# Patient Record
Sex: Female | Born: 1995 | Race: Black or African American | Hispanic: No | Marital: Single | Smoking: Never smoker
Health system: Southern US, Community
[De-identification: ages and names within clinical notes are randomized; demographics above are authoritative.]

## PROBLEM LIST (undated history)

## (undated) DIAGNOSIS — E039 Hypothyroidism, unspecified: Secondary | ICD-10-CM

## (undated) DIAGNOSIS — J45909 Unspecified asthma, uncomplicated: Secondary | ICD-10-CM

## (undated) DIAGNOSIS — E079 Disorder of thyroid, unspecified: Secondary | ICD-10-CM

## (undated) DIAGNOSIS — R569 Unspecified convulsions: Secondary | ICD-10-CM

## (undated) DIAGNOSIS — O039 Complete or unspecified spontaneous abortion without complication: Secondary | ICD-10-CM

## (undated) HISTORY — PX: NO PAST SURGERIES: SHX2092

---
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
## 2016-08-31 ENCOUNTER — Emergency Department (HOSPITAL_COMMUNITY)
Admission: EM | Admit: 2016-08-31 | Discharge: 2016-09-01 | Disposition: A | Discharge status: Home / Self Care | Payer: 59 | Attending: Emergency Medicine | Admitting: Emergency Medicine

## 2016-08-31 ENCOUNTER — Encounter (HOSPITAL_COMMUNITY): Payer: Self-pay | Admitting: *Deleted

## 2016-08-31 DIAGNOSIS — N898 Other specified noninflammatory disorders of vagina: Secondary | ICD-10-CM | POA: Diagnosis not present

## 2016-08-31 DIAGNOSIS — R109 Unspecified abdominal pain: Secondary | ICD-10-CM | POA: Diagnosis present

## 2016-08-31 DIAGNOSIS — D649 Anemia, unspecified: Secondary | ICD-10-CM | POA: Diagnosis not present

## 2016-08-31 DIAGNOSIS — R1084 Generalized abdominal pain: Secondary | ICD-10-CM

## 2016-08-31 DIAGNOSIS — E876 Hypokalemia: Secondary | ICD-10-CM | POA: Insufficient documentation

## 2016-08-31 DIAGNOSIS — J45909 Unspecified asthma, uncomplicated: Secondary | ICD-10-CM | POA: Insufficient documentation

## 2016-08-31 HISTORY — DX: Disorder of thyroid, unspecified: E07.9

## 2016-08-31 HISTORY — DX: Unspecified asthma, uncomplicated: J45.909

## 2016-08-31 NOTE — ED Triage Notes (Signed)
Pt c/o lower abdominal pain x 2 weeks, worsening in the past week. Pt denies vag bleeding or discharge.

## 2016-09-01 LAB — COMPREHENSIVE METABOLIC PANEL
ALT: 11 U/L — ABNORMAL LOW (ref 14–54)
AST: 16 U/L (ref 15–41)
Albumin: 3.4 g/dL — ABNORMAL LOW (ref 3.5–5.0)
Alkaline Phosphatase: 64 U/L (ref 38–126)
Anion gap: 6 (ref 5–15)
BUN: 9 mg/dL (ref 6–20)
CO2: 28 mmol/L (ref 22–32)
CREATININE: 0.57 mg/dL (ref 0.44–1.00)
Calcium: 9 mg/dL (ref 8.9–10.3)
Chloride: 107 mmol/L (ref 101–111)
GFR calc Af Amer: >60 mL/min (ref >60)
GFR calc non Af Amer: >60 mL/min (ref >60)
Glucose, Bld: 99 mg/dL (ref 65–99)
POTASSIUM: 3 mmol/L — AB (ref 3.5–5.1)
Sodium: 141 mmol/L (ref 135–145)
Total Bilirubin: 0.2 mg/dL — ABNORMAL LOW (ref 0.3–1.2)
Total Protein: 6.5 g/dL (ref 6.5–8.1)

## 2016-09-01 LAB — CBC
HCT: 33.7 % — ABNORMAL LOW (ref 36.0–46.0)
Hemoglobin: 10.4 g/dL — ABNORMAL LOW (ref 12.0–15.0)
MCH: 22.5 pg — ABNORMAL LOW (ref 26.0–34.0)
MCHC: 30.9 g/dL (ref 30.0–36.0)
MCV: 72.8 fL — ABNORMAL LOW (ref 78.0–100.0)
Platelets: 326 10*3/uL (ref 150–400)
RBC: 4.63 MIL/uL (ref 3.87–5.11)
RDW: 17.3 % — ABNORMAL HIGH (ref 11.5–15.5)
WBC: 5.5 10*3/uL (ref 4.0–10.5)

## 2016-09-01 LAB — URINALYSIS, ROUTINE W REFLEX MICROSCOPIC
Bilirubin Urine: NEGATIVE
Glucose, UA: NEGATIVE mg/dL
Hgb urine dipstick: NEGATIVE
Ketones, ur: NEGATIVE mg/dL
LEUKOCYTES UA: NEGATIVE
Nitrite: NEGATIVE
Protein, ur: NEGATIVE mg/dL
Specific Gravity, Urine: 1.031 — ABNORMAL HIGH (ref 1.005–1.030)
pH: 7 (ref 5.0–8.0)

## 2016-09-01 LAB — I-STAT BETA HCG BLOOD, ED (MC, WL, AP ONLY): I-stat hCG, quantitative: <5 m[IU]/mL (ref <5)

## 2016-09-01 LAB — WET PREP, GENITAL
Sperm: NONE SEEN
Trich, Wet Prep: NONE SEEN
YEAST WET PREP: NONE SEEN

## 2016-09-01 LAB — GC/CHLAMYDIA PROBE AMP (~~LOC~~) NOT AT ARMC
Chlamydia: NEGATIVE
Neisseria Gonorrhea: NEGATIVE

## 2016-09-01 LAB — LIPASE, BLOOD: Lipase: 38 U/L (ref 11–51)

## 2016-09-01 MED ORDER — POTASSIUM CHLORIDE CRYS ER 20 MEQ PO TBCR: 20.0000 meq | EXTENDED_RELEASE_TABLET | Freq: Every day | ORAL | 0 refills | Status: DC

## 2016-09-01 MED ORDER — FERROUS SULFATE 325 (65 FE) MG PO TABS: 325.0000 mg | ORAL_TABLET | Freq: Every day | ORAL | 0 refills | Status: DC

## 2016-09-01 MED ORDER — POTASSIUM CHLORIDE CRYS ER 20 MEQ PO TBCR
80.0000 meq | EXTENDED_RELEASE_TABLET | Freq: Once | ORAL | Status: AC
  Administered 2016-09-01: 80 meq via ORAL
  Filled 2016-09-01: qty 4

## 2016-09-01 NOTE — Discharge Instructions (Signed)
1. Medications: ferrous sulfate, klor con, usual home medications 2. Treatment: rest, drink plenty of fluids, advance diet slowly 3. Follow Up: Please followup with your primary doctor in 2 days for discussion of your diagnoses and further evaluation after today's visit; if you do not have a primary care doctor use the resource guide provided to find one; Please return to the ER for persistent vomiting, high fevers or worsening symptoms

## 2016-09-01 NOTE — ED Provider Notes (Signed)
[facility]-EMER[facility]ENCY DEPT Provider Note   CSN: [id number] Arrival date & time: 08/31/16  2344     History   Chief Complaint Chief Complaint  Patient presents with  . Abdominal Pain    HPI Leah Allen is a 20 y.o. female with a hx of asthma, thyroid disease presents to the Emergency Department complaining of intermittent lower abd pain onset 2 weeks ago but worsening tonight. Patient reports she is pain-free at this time. Pain is sharp and stabbing last approximately 30 seconds and occurs every 2-4 hours.  She denies nausea or vomiting with the pain.  Patient reports she is eating well and has had normal stools.  No melena or hematochezia. No aggravating or alleviating factors. Patient is 6 active with one female partner for the last 2 years. No history of STD. Pt denies fever, chills, headache, neck pain, chest pain, SOB, N/V/D, weakness, dizziness, syncope.  Pt also denies vaginal bleeding or discharge.  Denies urinary symptoms.  LMP: finished 3 days ago .     The history is provided by the patient and medical records. No language interpreter was used.  Abdominal Pain      Past Medical History:  Diagnosis Date  . Asthma   . Thyroid disease     There are no active problems to display for this patient.   History reviewed. No pertinent surgical history.  OB History    No data available       Home Medications    Prior to Admission medications   Medication Sig Start Date End Date Taking? Authorizing Provider  levothyroxine (SYNTHROID, LEVOTHROID) 25 MCG tablet Take 25 mcg by mouth daily before breakfast.   Yes Historical Provider, MD  ferrous sulfate 325 (65 FE) MG tablet Take 1 tablet (325 mg total) by mouth daily. 09/01/16   Avion Kutzer, PA-C  potassium chloride SA (K-DUR,KLOR-CON) 20 MEQ tablet Take 1 tablet (20 mEq total) by mouth daily. 09/01/16   Greysen Devino, PA-C    Family History No family history on file.  Social History Social History    Substance Use Topics  . Smoking status: Never Smoker  . Smokeless tobacco: Never Used  . Alcohol use Yes     Allergies   Sulfa antibiotics   Review of Systems Review of Systems  Gastrointestinal: Positive for abdominal pain.  All other systems reviewed and are negative.    Physical Exam Updated Vital Signs BP 95/76   Pulse 99   Temp 98.3 F (36.8 C) (Oral)   Resp 16   LMP 08/29/2016   SpO2 97%   Physical Exam  Constitutional: She appears well-developed and well-nourished. No distress.  Awake, alert, nontoxic appearance  HENT:  Head: Normocephalic and atraumatic.  Mouth/Throat: Oropharynx is clear and moist. No oropharyngeal exudate.  Eyes: Conjunctivae are normal. No scleral icterus.  Neck: Normal range of motion. Neck supple.  Cardiovascular: Normal rate, regular rhythm, normal heart sounds and intact distal pulses.   No murmur heard. Pulmonary/Chest: Effort normal and breath sounds normal. No respiratory distress. She has no wheezes.  Equal chest expansion  Abdominal: Soft. Bowel sounds are normal. She exhibits no mass. There is no tenderness. There is no rebound and no guarding. Hernia confirmed negative in the right inguinal area and confirmed negative in the left inguinal area.  Genitourinary: Uterus normal. No labial fusion. There is no rash, tenderness or lesion on the right labia. There is no rash, tenderness or lesion on the left labia. Uterus is not deviated,  not enlarged, not fixed and not tender. Cervix exhibits no motion tenderness, no discharge and no friability. Right adnexum displays no mass, no tenderness and no fullness. Left adnexum displays no mass, no tenderness and no fullness. No erythema, tenderness or bleeding in the vagina. No foreign body in the vagina. No signs of injury around the vagina. Vaginal discharge (Thin, white, non-odorous) found.  Musculoskeletal: Normal range of motion. She exhibits no edema.  Lymphadenopathy:       Right: No  inguinal adenopathy present.       Left: No inguinal adenopathy present.  Neurological: She is alert.  Speech is clear and goal oriented Moves extremities without ataxia  Skin: Skin is warm and dry. She is not diaphoretic. No erythema.  Psychiatric: She has a normal mood and affect.  Nursing note and vitals reviewed.    ED Treatments / Results  Labs (all labs ordered are listed, but only abnormal results are displayed) Labs Reviewed  WET PREP, GENITAL - Abnormal; Notable for the following:       Result Value   Clue Cells Wet Prep HPF POC PRESENT (*)    WBC, Wet Prep HPF POC MANY (*)    All other components within normal limits  COMPREHENSIVE METABOLIC PANEL - Abnormal; Notable for the following:    Potassium 3.0 (*)    Albumin 3.4 (*)    ALT 11 (*)    Total Bilirubin 0.2 (*)    All other components within normal limits  CBC - Abnormal; Notable for the following:    Hemoglobin 10.4 (*)    HCT 33.7 (*)    MCV 72.8 (*)    MCH 22.5 (*)    RDW 17.3 (*)    All other components within normal limits  URINALYSIS, ROUTINE W REFLEX MICROSCOPIC (NOT AT [facility]) - Abnormal; Notable for the following:    Specific Gravity, Urine 1.031 (*)    All other components within normal limits  LIPASE, BLOOD  I-STAT BETA HCG BLOOD, ED ([facility], [facility], [facility] ONLY)  GC/CHLAMYDIA PROBE AMP ([facility]) NOT AT [facility]    Procedures Procedures (including critical care time)  Medications Ordered in ED Medications  potassium chloride SA (K-DUR,KLOR-CON) CR tablet 80 mEq (80 mEq Oral Given 09/01/16 0153)     Initial Impression / Assessment and Plan / ED Course  I have reviewed the triage vital signs and the nursing notes.  Pertinent labs & imaging results that were available during my care of the patient were reviewed by me and considered in my medical decision making (see chart for details).  Clinical Course as of Sep 02 347  [facility] Sep 01, 2016  0119 Hypokalemia.  Replaced begun in the department.   Potassium:  (!) 3.0 [HM]  0120 No UTI Leukocytes, UA: NEGATIVE [HM]  0120 Anemia noted.  Unknown baseline.  Pt without CP or SOB.   Hemoglobin: (!) 10.4 [HM]  0120 Lipase: 38 [HM]  0346 Pt remains pain free.  On repeat exam her abd remains soft and nontender. She is well appearing. VSS.   [HM]    Clinical Course User Index [HM] Dierdre Forth, PA-C    Patient with intermittent lower abdominal pain. Not currently present.  On exam patient was soft and nontender abdomen; no rebound or guarding.  No cervical motion or adnexal tenderness. No adnexal masses. Doubt appendicitis, colitis, diverticulitis, tubo-ovarian abscess, PID or ovarian torsion. Patient is well-appearing, eating and drinking within the room without difficulty. Hypokalemia noted and repletion begun. Mild  anemia. Suspect iron deficiency. Patient has never seen an OB/GYN. Will have her follow with PCP and OB/GYN. Iron and potassium supplements given.  BP 119/90 (BP Location: Right Arm)   Pulse 96   Temp 98.3 F (36.8 C) (Oral)   Resp 16   LMP 08/29/2016   SpO2 99%    Final Clinical Impressions(s) / ED Diagnoses   Final diagnoses:  Hypokalemia  Anemia, unspecified type  Generalized abdominal pain    New Prescriptions New Prescriptions   FERROUS SULFATE 325 (65 FE) MG TABLET    Take 1 tablet (325 mg total) by mouth daily.   POTASSIUM CHLORIDE SA (K-DUR,KLOR-CON) 20 MEQ TABLET    Take 1 tablet (20 mEq total) by mouth daily.     Dahlia ClientHannah Amairani Shuey, PA-C 09/01/16 0355    Layla MawKristen N Ward, DO 09/01/16 [phone]

## 2016-09-08 ENCOUNTER — Emergency Department (HOSPITAL_COMMUNITY)
Admission: EM | Admit: 2016-09-08 | Discharge: 2016-09-08 | Disposition: A | Discharge status: Home / Self Care | Payer: 59 | Attending: Emergency Medicine | Admitting: Emergency Medicine

## 2016-09-08 ENCOUNTER — Encounter (HOSPITAL_COMMUNITY): Payer: Self-pay | Admitting: Emergency Medicine

## 2016-09-08 DIAGNOSIS — H9203 Otalgia, bilateral: Secondary | ICD-10-CM | POA: Diagnosis present

## 2016-09-08 DIAGNOSIS — J45909 Unspecified asthma, uncomplicated: Secondary | ICD-10-CM | POA: Insufficient documentation

## 2016-09-08 DIAGNOSIS — H6693 Otitis media, unspecified, bilateral: Secondary | ICD-10-CM | POA: Diagnosis not present

## 2016-09-08 MED ORDER — OXYMETAZOLINE HCL 0.05 % NA SOLN
1.0000 | Freq: Once | NASAL | Status: AC
  Administered 2016-09-08: 1 via NASAL
  Filled 2016-09-08: qty 15

## 2016-09-08 MED ORDER — AMOXICILLIN 500 MG PO CAPS: 500.0000 mg | ORAL_CAPSULE | Freq: Three times a day (TID) | ORAL | 0 refills | Status: DC

## 2016-09-08 MED ORDER — AMOXICILLIN 500 MG PO CAPS
500.0000 mg | ORAL_CAPSULE | Freq: Once | ORAL | Status: AC
  Administered 2016-09-08: 500 mg via ORAL
  Filled 2016-09-08: qty 1

## 2016-09-08 MED ORDER — IBUPROFEN 800 MG PO TABS
800.0000 mg | ORAL_TABLET | Freq: Once | ORAL | Status: AC
  Administered 2016-09-08: 800 mg via ORAL
  Filled 2016-09-08: qty 1

## 2016-09-08 MED ORDER — FLUTICASONE PROPIONATE 50 MCG/ACT NA SUSP
2.0000 | Freq: Every day | NASAL | 2 refills | Status: DC
Start: 2016-09-08 — End: 2017-08-15

## 2016-09-08 NOTE — ED Provider Notes (Signed)
[facility] Provider Note   CSN: [id number] Arrival date & time: 09/08/16  0149     History   Chief Complaint Chief Complaint  Patient presents with  . Otalgia    HPI Leah Allen is a 20 y.o. female with a hx of asthma, thyroid disease presents to the Emergency Department complaining of gradual, persistent, progressively worsening bilateral ear pain.  Pt reports L ear pain x 3 days and right ear pain x 2-3 hours.  Associated symptoms include URI symptoms times 3-4 days including nasal congestion, postnasal drip, rhinorrhea and sinus fullness. Patient reports taking DayQuil with some relief. Cold air seems to make the symptoms worse. Patient denies fever, chills, neck pain, chest pain, shortness of breath, abdominal pain, nausea, vomiting, rash.     The history is provided by the patient and medical records. No language interpreter was used.    Past Medical History:  Diagnosis Date  . Asthma   . Thyroid disease     There are no active problems to display for this patient.   History reviewed. No pertinent surgical history.  OB History    No data available       Home Medications    Prior to Admission medications   Medication Sig Start Date End Date Taking? Authorizing Provider  amoxicillin (AMOXIL) 500 MG capsule Take 1 capsule (500 mg total) by mouth 3 (three) times daily. 09/08/16   Julious Langlois, PA-C  ferrous sulfate 325 (65 FE) MG tablet Take 1 tablet (325 mg total) by mouth daily. 09/01/16   Ravenna Legore, PA-C  fluticasone (FLONASE) 50 MCG/ACT nasal spray Place 2 sprays into both nostrils daily. 09/08/16   Carvin Almas, PA-C  levothyroxine (SYNTHROID, LEVOTHROID) 25 MCG tablet Take 25 mcg by mouth daily before breakfast.    Historical Provider, MD  potassium chloride SA (K-DUR,KLOR-CON) 20 MEQ tablet Take 1 tablet (20 mEq total) by mouth daily. 09/01/16   Kristoph Sattler, PA-C    Family History No family history on  file.  Social History Social History  Substance Use Topics  . Smoking status: Never Smoker  . Smokeless tobacco: Never Used  . Alcohol use Yes     Allergies   Sulfa antibiotics   Review of Systems Review of Systems  HENT: Positive for congestion, ear pain ( Bilateral), postnasal drip, rhinorrhea, sinus pressure and sore throat.   Respiratory: Negative for cough.   All other systems reviewed and are negative.    Physical Exam Updated Vital Signs BP 114/82 (BP Location: Right Arm)   Pulse 98   Temp 97.9 F (36.6 C) (Oral)   Resp 18   LMP 08/29/2016   SpO2 100%   Physical Exam  Constitutional: She appears well-developed and well-nourished. No distress.  HENT:  Head: Normocephalic and atraumatic.  Right Ear: External ear and ear canal normal. Tympanic membrane is injected, erythematous and bulging. A middle ear effusion is present.  Left Ear: External ear and ear canal normal. Tympanic membrane is injected, erythematous and bulging. A middle ear effusion is present.  Nose: Mucosal edema and rhinorrhea present. No epistaxis. Right sinus exhibits no maxillary sinus tenderness and no frontal sinus tenderness. Left sinus exhibits no maxillary sinus tenderness and no frontal sinus tenderness.  Mouth/Throat: Uvula is midline and mucous membranes are normal. Mucous membranes are not pale and not cyanotic. No oropharyngeal exudate, posterior oropharyngeal edema, posterior oropharyngeal erythema or tonsillar abscesses.  Eyes: Conjunctivae are normal. Pupils are equal, round, and reactive to light.  Neck: Normal range of motion and full passive range of motion without pain.  Cardiovascular: Normal rate and intact distal pulses.   Pulmonary/Chest: Effort normal and breath sounds normal. No stridor.  Clear and equal breath sounds without focal wheezes, rhonchi, rales  Abdominal: Soft. There is no tenderness.  Musculoskeletal: Normal range of motion.  Lymphadenopathy:    She has no  cervical adenopathy.  Neurological: She is alert.  Skin: Skin is warm and dry. No rash noted. She is not diaphoretic.  Psychiatric: She has a normal mood and affect.  Nursing note and vitals reviewed.    ED Treatments / Results   Procedures Procedures (including critical care time)  Medications Ordered in ED Medications  ibuprofen (ADVIL,MOTRIN) tablet 800 mg (800 mg Oral Given 09/08/16 0235)  oxymetazoline (AFRIN) 0.05 % nasal spray 1 spray (1 spray Each Nare Given 09/08/16 0235)  amoxicillin (AMOXIL) capsule 500 mg (500 mg Oral Given 09/08/16 0235)     Initial Impression / Assessment and Plan / ED Course  I have reviewed the triage vital signs and the nursing notes.  Pertinent labs & imaging results that were available during my care of the patient were reviewed by me and considered in my medical decision making (see chart for details).  Clinical Course as of Sep 08 244  Mon Sep 08, 2016  0209 Tachycardia noted at triage Pulse Rate: 110 [HM]    Clinical Course User Index [HM] Dierdre ForthHannah Anwita Mencer, PA-C     Patient presents with otalgia and exam consistent with acute otitis media. No concern for acute mastoiditis, meningitis.  Patient given amoxicillin.  Advised parents to call PCP today for follow-up.  I have also discussed reasons to return immediately to the ER.  Parent expresses understanding and agrees with plan.     Final Clinical Impressions(s) / ED Diagnoses   Final diagnoses:  Bilateral otitis media, unspecified otitis media type    New Prescriptions New Prescriptions   AMOXICILLIN (AMOXIL) 500 MG CAPSULE    Take 1 capsule (500 mg total) by mouth 3 (three) times daily.   FLUTICASONE (FLONASE) 50 MCG/ACT NASAL SPRAY    Place 2 sprays into both nostrils daily.     Dahlia ClientHannah Cassian Torelli, PA-C 09/08/16 0245    Dione Boozeavid Glick, MD 09/08/16 [phone]

## 2016-09-08 NOTE — ED Triage Notes (Signed)
L ear pain x 3 days.  Severe R ear pain x 2-3 hours.

## 2016-09-08 NOTE — Discharge Instructions (Signed)
1. Medications: amoxicillin, flonase, usual home medications 2. Treatment: rest, drink plenty of fluids, 3. Follow Up: Please followup with your primary doctor in 3-5 days for discussion of your diagnoses and further evaluation after today's visit; if you do not have a primary care doctor use the resource guide provided to find one; Please return to the ER for high fevers, drainage from the ear or other concerns

## 2017-08-10 ENCOUNTER — Inpatient Hospital Stay (HOSPITAL_COMMUNITY)
Admission: AC | Admit: 2017-08-10 | Discharge: 2017-08-11 | DRG: 779 | Disposition: A | Discharge status: Home / Self Care | Payer: BLUE CROSS/BLUE SHIELD | Source: Ambulatory Visit | Attending: Family Medicine | Admitting: Family Medicine

## 2017-08-10 ENCOUNTER — Encounter (HOSPITAL_COMMUNITY): Payer: Self-pay | Admitting: *Deleted

## 2017-08-10 DIAGNOSIS — Z3A19 19 weeks gestation of pregnancy: Secondary | ICD-10-CM | POA: Diagnosis not present

## 2017-08-10 DIAGNOSIS — J45909 Unspecified asthma, uncomplicated: Secondary | ICD-10-CM | POA: Diagnosis present

## 2017-08-10 DIAGNOSIS — O9952 Diseases of the respiratory system complicating childbirth: Secondary | ICD-10-CM | POA: Diagnosis present

## 2017-08-10 DIAGNOSIS — Z882 Allergy status to sulfonamides status: Secondary | ICD-10-CM

## 2017-08-10 DIAGNOSIS — E039 Hypothyroidism, unspecified: Secondary | ICD-10-CM | POA: Diagnosis present

## 2017-08-10 DIAGNOSIS — Z6791 Unspecified blood type, Rh negative: Secondary | ICD-10-CM

## 2017-08-10 DIAGNOSIS — O021 Missed abortion: Secondary | ICD-10-CM | POA: Diagnosis present

## 2017-08-10 DIAGNOSIS — O26899 Other specified pregnancy related conditions, unspecified trimester: Secondary | ICD-10-CM

## 2017-08-10 DIAGNOSIS — Z79899 Other long term (current) drug therapy: Secondary | ICD-10-CM

## 2017-08-10 DIAGNOSIS — O99284 Endocrine, nutritional and metabolic diseases complicating childbirth: Secondary | ICD-10-CM | POA: Diagnosis present

## 2017-08-10 HISTORY — DX: Hypothyroidism, unspecified: E03.9

## 2017-08-10 LAB — CBC
HCT: 27.4 % — ABNORMAL LOW (ref 36.0–46.0)
HEMOGLOBIN: 8.6 g/dL — AB (ref 12.0–15.0)
MCH: 21.8 pg — AB (ref 26.0–34.0)
MCHC: 31.4 g/dL (ref 30.0–36.0)
MCV: 69.5 fL — ABNORMAL LOW (ref 78.0–100.0)
PLATELETS: 274 10*3/uL (ref 150–400)
RBC: 3.94 MIL/uL (ref 3.87–5.11)
RDW: 18.2 % — ABNORMAL HIGH (ref 11.5–15.5)
WBC: 16.4 10*3/uL — AB (ref 4.0–10.5)

## 2017-08-10 LAB — ABO/RH: ABO/RH(D): B NEG

## 2017-08-10 LAB — TYPE AND SCREEN
ABO/RH(D): B NEG
ANTIBODY SCREEN: NEGATIVE

## 2017-08-10 MED ORDER — TETANUS-DIPHTH-ACELL PERTUSSIS 5-2.5-18.5 LF-MCG/0.5 IM SUSP: 0.5000 mL | Freq: Once | INTRAMUSCULAR | Status: DC

## 2017-08-10 MED ORDER — PRENATAL MULTIVITAMIN CH: 1.0000 | ORAL_TABLET | Freq: Every day | ORAL | Status: DC

## 2017-08-10 MED ORDER — ONDANSETRON HCL 4 MG/2ML IJ SOLN: 4.0000 mg | INTRAMUSCULAR | Status: DC | PRN

## 2017-08-10 MED ORDER — OXYTOCIN 10 UNIT/ML IJ SOLN
10.0000 [IU] | Freq: Once | INTRAMUSCULAR | Status: AC
Start: 2017-08-10 — End: 2017-08-10
  Administered 2017-08-10: 10 [IU] via INTRAMUSCULAR

## 2017-08-10 MED ORDER — COCONUT OIL OIL: 1.0000 "application " | TOPICAL_OIL | Status: DC | PRN

## 2017-08-10 MED ORDER — OXYTOCIN 10 UNIT/ML IJ SOLN
INTRAMUSCULAR | Status: AC
  Administered 2017-08-10: 10 [IU] via INTRAMUSCULAR
  Filled 2017-08-10: qty 1

## 2017-08-10 MED ORDER — SENNOSIDES-DOCUSATE SODIUM 8.6-50 MG PO TABS: 2.0000 | ORAL_TABLET | ORAL | Status: DC

## 2017-08-10 MED ORDER — LIDOCAINE HCL (PF) 1 % IJ SOLN
INTRAMUSCULAR | Status: AC
  Filled 2017-08-10: qty 30

## 2017-08-10 MED ORDER — DIBUCAINE 1 % RE OINT: 1.0000 "application " | TOPICAL_OINTMENT | RECTAL | Status: DC | PRN

## 2017-08-10 MED ORDER — WITCH HAZEL-GLYCERIN EX PADS: 1.0000 "application " | MEDICATED_PAD | CUTANEOUS | Status: DC | PRN

## 2017-08-10 MED ORDER — RHO D IMMUNE GLOBULIN 1500 UNIT/2ML IJ SOSY
300.0000 ug | PREFILLED_SYRINGE | Freq: Once | INTRAMUSCULAR | Status: AC
  Administered 2017-08-11: 300 ug via INTRAMUSCULAR
  Filled 2017-08-10: qty 2

## 2017-08-10 MED ORDER — BENZOCAINE-MENTHOL 20-0.5 % EX AERO: 1.0000 "application " | INHALATION_SPRAY | CUTANEOUS | Status: DC | PRN

## 2017-08-10 MED ORDER — SIMETHICONE 80 MG PO CHEW: 80.0000 mg | CHEWABLE_TABLET | ORAL | Status: DC | PRN

## 2017-08-10 MED ORDER — ONDANSETRON HCL 4 MG PO TABS: 4.0000 mg | ORAL_TABLET | ORAL | Status: DC | PRN

## 2017-08-10 MED ORDER — IBUPROFEN 600 MG PO TABS
600.0000 mg | ORAL_TABLET | Freq: Four times a day (QID) | ORAL | Status: DC
  Administered 2017-08-10: 600 mg via ORAL
  Filled 2017-08-10: qty 1

## 2017-08-10 MED ORDER — ZOLPIDEM TARTRATE 5 MG PO TABS: 5.0000 mg | ORAL_TABLET | Freq: Every evening | ORAL | Status: DC | PRN

## 2017-08-10 MED ORDER — DIPHENHYDRAMINE HCL 25 MG PO CAPS: 25.0000 mg | ORAL_CAPSULE | Freq: Four times a day (QID) | ORAL | Status: DC | PRN

## 2017-08-10 MED ORDER — ACETAMINOPHEN 325 MG PO TABS: 650.0000 mg | ORAL_TABLET | ORAL | Status: DC | PRN

## 2017-08-10 NOTE — H&P (Signed)
Obstetric History and Physical  Leah Allen is a 21 y.o. G1P0 with IUP at Unknown presenting s/p SVD at home. Patient states that she started feeling unwell at work around noontime. She went home to rest after work and started feeling really bad. She endorses some fever/chills, shakes, severe abdominal pain. She thought she had to have a bowel movement so went to the bathroom. Instead she delivered her baby in toilet.   Patient had no prenatal care this pregnancy. States she was taking her PNV.   Baby was a stillborn and was not viable based off weight.   Prenatal Course Source of Care: No prenatal care[facility] Dating: By LMP -->  Estimated Date of Delivery: 10/27/2017  Prenatal labs and studies: ABO, Rh:  UNKNOWN Antibody:   UNKNOWN Rubella:   UNKNOWN RPR:   UNKNOWN HBsAg:   UNKNOWN HIV:   UNKNOWN GBS:  UNKNOWN 1 hr Glucola  None Genetic screening None Anatomy US None   Past Medical History:  Diagnosis Date  . Asthma   . Hypothyroidism   . Thyroid disease     History reviewed. No pertinent surgical history.  OB History  No data available    Social History   Social History  . Marital status: Single    Spouse name: N/A  . Number of children: N/A  . Years of education: N/A   Social History Main Topics  . Smoking status: Never Smoker  . Smokeless tobacco: Never Used  . Alcohol use Yes  . Drug use: Yes    Types: Marijuana  . Sexual activity: Yes   Other Topics Concern  . None   Social History Narrative  . None    History reviewed. No pertinent family history.  Prescriptions Prior to Admission  Medication Sig Dispense Refill Last Dose  . amoxicillin (AMOXIL) 500 MG capsule Take 1 capsule (500 mg total) by mouth 3 (three) times daily. 21 capsule 0   . ferrous sulfate 325 (65 FE) MG tablet Take 1 tablet (325 mg total) by mouth daily. 30 tablet 0   . fluticasone (FLONASE) 50 MCG/ACT nasal spray Place 2  sprays into both nostrils daily. 9.9 g 2   . levothyroxine (SYNTHROID, LEVOTHROID) 25 MCG tablet Take 25 mcg by mouth daily before breakfast.   08/31/2016 at Unknown time  . potassium chloride SA (K-DUR,KLOR-CON) 20 MEQ tablet Take 1 tablet (20 mEq total) by mouth daily. 7 tablet 0     Allergies  Allergen Reactions  . Sulfa Antibiotics Anaphylaxis and Swelling    Review of Systems: Negative except for what is mentioned in HPI.  Physical Exam: BP (!) 114/54   Pulse (!) 124   Resp 18   Ht  (1.575 m)   Wt 81.6 kg (180 lb)   BMI 32.92 kg/m  CONSTITUTIONAL: Well-developed, well-nourished female in no acute distress.  HENT:  Normocephalic, atraumatic, External right and left ear normal. Oropharynx is clear and moist EYES: Conjunctivae and EOM are normal. Pupils are equal, round, and reactive to light. No scleral icterus.  NECK: Normal range of motion, supple, no masses SKIN: Skin is warm and dry. No rash noted. Not diaphoretic. No erythema. No pallor. NEUROLOGIC: Alert and oriented to person, place, and time. Normal reflexes, muscle tone coordination. No cranial nerve deficit noted. PSYCHIATRIC: Normal mood and affect. Normal behavior. Normal judgment and thought content. CARDIOVASCULAR: Normal heart rate noted, regular rhythm RESPIRATORY: Effort and breath sounds normal, no problems  with respiration noted ABDOMEN: Soft, nontender, nondistended, gravid. MUSCULOSKELETAL: Normal range of motion. No edema and no tenderness. 2+ distal pulses.   Pertinent Labs/Studies:   No results found for this or any previous visit (from the past 24 hour(s)).  Assessment : Leah Allen is a 21 y.o. who is s/p SVD at home of preterm, previable infant.   Plan: Routine postpartum care IM Pitocin for prophylaxis after delivery CSW consult and chaplin prn  Caryl Ada, DO OB Fellow Faculty Practice, [facility] [place] 08/10/2017, 9:05 PM

## 2017-08-10 NOTE — Discharge Instructions (Signed)

## 2017-08-10 NOTE — Discharge Summary (Signed)
OB Discharge Summary  Patient Name: Leah Allen DOB: [birth year] MRN: [id number]  Date of admission: 08/10/2017 Delivering MD:     Date of discharge: 08/10/2017  Admitting diagnosis: BROUGHT IN ON EMS Intrauterine pregnancy: [redacted]w[redacted]d     Secondary diagnosis:Active Problems:   Postpartum care following vaginal delivery  Additional problems:second trimester loss     Discharge diagnosis: 19 wk loss                                                                     Post partum procedures:rhogam  Augmentation: none  Complications: [facility] course:  Onset of Labor With Vaginal Delivery     21 y.o. yo G1P0 at [redacted]w[redacted]d was admitted following delivery at home on 08/10/2017. Patient had an uncomplicated labor course as follows:  Arrived via EMS. Placenta had already delivered. Patient had a delivery of a Non Viable infant. Information for the patient's newborn:  Leah Allen [[id number]]       Pateint had an uncomplicated postpartum course.  She is ambulating, tolerating a regular diet, passing flatus, and urinating well. Patient is discharged home in stable condition on 08/10/17. She received Rhogam.   Physical exam  Vitals:   08/10/17 2145 08/10/17 2200 08/10/17 2231 08/10/17 2322  BP: 96/61 103/61 138/68 135/64  Pulse: (!) 120 (!) 114 (!) 111 (!) 108  Resp: Temp:    98.8 F (37.1 C)  TempSrc:    Oral  Weight:      Height:       General: alert, cooperative and no distress Lochia: appropriate Uterine Fundus: firm DVT Evaluation: No evidence of DVT seen on physical exam. Labs: Lab Results  Component Value Date   WBC 16.4 (H) 08/10/2017   HGB 8.6 (L) 08/10/2017   HCT 27.4 (L) 08/10/2017   MCV 69.5 (L) 08/10/2017   PLT 274 08/10/2017   CMP Latest Ref Rng & Units 08/31/2016  Glucose 65 - 99 mg/dL 99  BUN 6 - 20 mg/dL 9  Creatinine 4.69 - 6.29 mg/dL 5.28  Sodium 413 - 244 mmol/L 141  Potassium 3.5 - 5.1 mmol/L 3.0(L)   Chloride 101 - 111 mmol/L 107  CO2 22 - 32 mmol/L 28  Calcium 8.9 - 10.3 mg/dL 9.0  Total Protein 6.5 - 8.1 g/dL 6.5  Total Bilirubin 0.3 - 1.2 mg/dL 0.1(U)  Alkaline Phos 38 - 126 U/L 64  AST 15 - 41 U/L 16  ALT 14 - 54 U/L 11(L)    Discharge instruction: per After Visit Summary and "Baby and Me Booklet".  After Visit Meds:  Allergies as of 08/10/2017      Reactions   Sulfa Antibiotics Anaphylaxis, Swelling      Medication List    TAKE these medications   amoxicillin 500 MG capsule Commonly known as:  AMOXIL Take 1 capsule (500 mg total) by mouth 3 (three) times daily.   ferrous sulfate 325 (65 FE) MG tablet Take 1 tablet (325 mg total) by mouth daily.   fluticasone 50 MCG/ACT nasal spray Commonly known as:  FLONASE Place 2 sprays into both nostrils daily.   levothyroxine 25 MCG tablet Commonly known as:  SYNTHROID, LEVOTHROID Take 25 mcg  by mouth daily before breakfast.   potassium chloride SA 20 MEQ tablet Commonly known as:  K-DUR,KLOR-CON Take 1 tablet (20 mEq total) by mouth daily.       Diet: routine diet  Activity: Advance as tolerated. Pelvic rest for 6 weeks.   Outpatient follow up:4 wks Follow up Appt:No future appointments. Follow up visit: No Follow-up on file.  Postpartum contraception: Condoms  Newborn Data: Still born female  Birth Weight:  230 gms   Newborn Delivery   Birth date/time:   Delivery type:       Disposition:pathology   08/10/2017 Reva Bores, MD

## 2017-08-12 LAB — RH IG WORKUP (INCLUDES ABO/RH)
ABO/RH(D): B NEG
GESTATIONAL AGE(WKS): 19
UNIT DIVISION: 0

## 2017-08-15 ENCOUNTER — Inpatient Hospital Stay (HOSPITAL_COMMUNITY)
Admission: AD | Admit: 2017-08-15 | Discharge: 2017-08-15 | Disposition: A | Discharge status: Home / Self Care | Payer: BLUE CROSS/BLUE SHIELD | Source: Ambulatory Visit | Attending: Obstetrics and Gynecology | Admitting: Obstetrics and Gynecology

## 2017-08-15 ENCOUNTER — Encounter (HOSPITAL_COMMUNITY): Payer: Self-pay

## 2017-08-15 DIAGNOSIS — R109 Unspecified abdominal pain: Secondary | ICD-10-CM | POA: Diagnosis present

## 2017-08-15 DIAGNOSIS — O8612 Endometritis following delivery: Secondary | ICD-10-CM | POA: Insufficient documentation

## 2017-08-15 DIAGNOSIS — N719 Inflammatory disease of uterus, unspecified: Secondary | ICD-10-CM

## 2017-08-15 HISTORY — DX: Unspecified convulsions: R56.9

## 2017-08-15 LAB — CBC WITH DIFFERENTIAL/PLATELET
Basophils Absolute: 0 10*3/uL (ref 0.0–0.1)
Basophils Relative: 0 %
EOS PCT: 4 %
Eosinophils Absolute: 0.4 10*3/uL (ref 0.0–0.7)
HEMATOCRIT: 25.5 % — AB (ref 36.0–46.0)
Hemoglobin: 7.8 g/dL — ABNORMAL LOW (ref 12.0–15.0)
LYMPHS ABS: 2.8 10*3/uL (ref 0.7–4.0)
Lymphocytes Relative: 32 %
MCH: 21.8 pg — AB (ref 26.0–34.0)
MCHC: 30.6 g/dL (ref 30.0–36.0)
MCV: 71.4 fL — ABNORMAL LOW (ref 78.0–100.0)
Monocytes Absolute: 0.4 10*3/uL (ref 0.1–1.0)
Monocytes Relative: 5 %
NEUTROS ABS: 5.2 10*3/uL (ref 1.7–7.7)
Neutrophils Relative %: 59 %
OTHER: 0 %
Platelets: 324 10*3/uL (ref 150–400)
RBC: 3.57 MIL/uL — AB (ref 3.87–5.11)
RDW: 18.6 % — AB (ref 11.5–15.5)
WBC: 8.8 10*3/uL (ref 4.0–10.5)

## 2017-08-15 LAB — URINALYSIS, ROUTINE W REFLEX MICROSCOPIC
Bacteria, UA: NONE SEEN
Bilirubin Urine: NEGATIVE
GLUCOSE, UA: NEGATIVE mg/dL
KETONES UR: NEGATIVE mg/dL
Nitrite: NEGATIVE
PH: 5 (ref 5.0–8.0)
Protein, ur: 100 mg/dL — AB
SPECIFIC GRAVITY, URINE: 1.04 — AB (ref 1.005–1.030)

## 2017-08-15 LAB — WET PREP, GENITAL
CLUE CELLS WET PREP: NONE SEEN
SPERM: NONE SEEN
Trich, Wet Prep: NONE SEEN
Yeast Wet Prep HPF POC: NONE SEEN

## 2017-08-15 MED ORDER — CEFTRIAXONE SODIUM 1 G IJ SOLR
1.0000 g | Freq: Once | INTRAMUSCULAR | Status: AC
  Administered 2017-08-15: 1 g via INTRAMUSCULAR
  Filled 2017-08-15: qty 10

## 2017-08-15 MED ORDER — AZITHROMYCIN 500 MG PO TABS
1000.0000 mg | ORAL_TABLET | Freq: Once | ORAL | Status: AC
  Administered 2017-08-15: 1000 mg via ORAL
  Filled 2017-08-15: qty 2

## 2017-08-15 MED ORDER — OXYCODONE-ACETAMINOPHEN 5-325 MG PO TABS
1.0000 | ORAL_TABLET | Freq: Once | ORAL | Status: AC
  Administered 2017-08-15: 1 via ORAL
  Filled 2017-08-15: qty 1

## 2017-08-15 MED ORDER — OXYCODONE-ACETAMINOPHEN 5-325 MG PO TABS: 1.0000 | ORAL_TABLET | ORAL | 0 refills | Status: DC | PRN

## 2017-08-15 MED ORDER — CEPHALEXIN 500 MG PO CAPS: 500.0000 mg | ORAL_CAPSULE | Freq: Four times a day (QID) | ORAL | 0 refills | Status: DC

## 2017-08-15 NOTE — Discharge Instructions (Signed)
Endometritis °Endometritis is irritation, soreness, or inflammation that affects the lining of the uterus (endometrium). °Infection is usually the cause of endometritis. It is important to get treatment to prevent complications. Common complications may include more severe infections and not being able to have children(infertility). °What are the causes? °This condition may be caused by: °· Bacterial infections. °· STIs (sexually transmitted infections). °· A miscarriage or childbirth, especially after a long labor or cesarean delivery. °· Certain gynecological procedures. These may include dilation and curettage (D&C), hysteroscopy, or birth control (contraceptive) insertion. °· Tuberculosis (TB). ° °What are the signs or symptoms? °Symptoms of this condition include: °· Fever. °· Lower abdomen (abdominal) pain. °· Pelvis (pelvic) pain. °· Abnormal vaginal discharge or bleeding. °· Abdominal bloating (distention) or swelling. °· General discomfort or generally feeling ill. °· Discomfort with bowel movements. °· Constipation. ° °How is this diagnosed? °This condition may be diagnosed based on: °· A physical exam, including a pelvic exam. °· Tests, such as: °? Blood tests. °? Removal of a sample of endometrial tissue for testing (endometrial biopsy). °? Examining a sample of vaginal discharge under a microscope (wet prep). °? Removal of a sample of fluid from the cervix for testing (cervical culture). °? Surgical examination of the pelvis and abdomen. ° °How is this treated? °This condition is treated with: °· Antibiotic medicines. °· For more severe cases, hospitalization may be needed to give fluids and antibiotics directly into a vein through an IV tube. ° °Follow these instructions at home: °· Take over-the-counter and prescription medicines only as told by your health care provider. °· Drink enough fluid to keep your urine clear or pale yellow. °· Take your antibiotic medicine as told by your health care  provider. Do not stop taking the antibiotic even if you start to feel better. °· Do not douche or have sex (including vaginal, oral, and anal sex) until your health care provider approves. °· If your endometritis was caused by an STI, do not have sex (including vaginal, oral, and anal sex) until your partner has also been treated for the STI. °· Return to your normal activities as told by your health care provider. Ask your health care provider what activities are safe for you. °· Keep all follow-up visits as told by your health care provider. This is important. °Contact a health care provider if: °· You have pain that does not get better with medicine. °· You have a fever. °· You have pain with bowel movements. °Get help right away if: °· You have abdominal swelling. °· You have abdominal pain that gets worse. °· You have bad-smelling vaginal discharge, or an increased amount of vaginal discharge. °· You have abnormal vaginal bleeding. °· You have nausea and vomiting. °Summary °· Endometritis affects the lining of the uterus (endometrium) and is usually caused by an infection. °· It is important to get treatment to prevent complications. °· You have several treatment options for endometritis. Treatment may include antibiotics and IV fluids. °· Take your antibiotic medicine as told by your health care provider. Do not stop taking the antibiotic even if you start to feel better. °· Do not douche or have sex (including vaginal, oral, and anal sex) until your health care provider approves. °This information is not intended to replace advice given to you by your health care provider. Make sure you discuss any questions you have with your health care provider. °Document Released: 10/07/2001 Document Revised: 10/28/2016 Document Reviewed: 10/28/2016 °Elsevier Interactive Patient Education © 2017   [employer] ° °

## 2017-08-15 NOTE — MAU Provider Note (Signed)
History   pt is s/p 19 wk loss on 08/10/17. States started having increased abd pain and tenderness with chills has not taken temp. States pain is now severe and even hurts to walk.pt has not been screen for STDS.  CSN: [id number]  Arrival date & time 08/15/17  1623   None     Chief Complaint  Patient presents with  . Abdominal Pain    HPI  Past Medical History:  Diagnosis Date  . Asthma   . Hypothyroidism   . 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 Thyroid disease     Past Surgical History:  Procedure Laterality Date  . NO PAST SURGERIES      Family History  Problem Relation Age of Onset  . Diabetes Father     Social History  Substance Use Topics  . Smoking status: Never Smoker  . Smokeless tobacco: Never Used  . Alcohol use Yes    OB History    Gravida Para Term Preterm AB Living   1         0   SAB TAB Ectopic Multiple Live Births                  Review of Systems  Constitutional: Positive for chills.  HENT: Negative.   Eyes: Negative.   Respiratory: Negative.   Cardiovascular: Negative.   Gastrointestinal: Positive for abdominal pain.  Endocrine: Negative.   Genitourinary: Positive for difficulty urinating, vaginal bleeding and vaginal pain.  Musculoskeletal: Negative.   Skin: Negative.   Allergic/Immunologic: Negative.   Neurological: Negative.   Hematological: Negative.   Psychiatric/Behavioral: Negative.     Allergies  Sulfa antibiotics  Home Medications    BP (!) 145/77 (BP Location: Left Arm)   Pulse (!) 112   Temp 98.8 F (37.1 C) (Oral)   Resp 20   SpO2 99%   Breastfeeding? Unknown   Physical Exam  Constitutional: She is oriented to person, place, and time. She appears well-developed and well-nourished.  HENT:  Head: Normocephalic.  Neck: Normal range of motion.  Cardiovascular: Normal rate, regular rhythm, normal heart sounds and intact distal pulses.   Pulmonary/Chest: Effort normal and breath  sounds normal.  Abdominal: There is tenderness. There is guarding.  Genitourinary: Vaginal discharge found.  Musculoskeletal: Normal range of motion.  Neurological: She is alert and oriented to person, place, and time. She has normal reflexes.  Skin: Skin is warm and dry.  Psychiatric: She has a normal mood and affect. Her behavior is normal. Judgment and thought content normal.    MAU Course  Procedures (including critical care time)  Labs Reviewed  URINALYSIS, ROUTINE W REFLEX MICROSCOPIC - Abnormal; Notable for the following:       Result Value   APPearance CLOUDY (*)    Specific Gravity, Urine 1.040 (*)    Hgb urine dipstick LARGE (*)    Protein, ur 100 (*)    Leukocytes, UA MODERATE (*)    Squamous Epithelial / LPF 6-30 (*)    All other components within normal limits  WET PREP, GENITAL  CBC WITH DIFFERENTIAL/PLATELET  GC/CHLAMYDIA PROBE AMP ([place]) NOT AT [facility]   No results found.   1. Endometritis       MDM  VSS, sterile spec done. sm ant mucopurulent vag bleeding, cultures obtained, wet prep, post CMT, and pt severely uncomfortable with bimanual exam.  Will get cbc with diff, IM rocephen ans azithro. POC discussed  with Dr. Jolayne Panther. Will d/c pt home on keflex QID and pain management.

## 2017-08-15 NOTE — MAU Note (Signed)
Pt presents with c/o sharp lower abdominal pain.  Reports delivered vaginally 08/10/17 @ [redacted]wks gestation, states pain began to worsen Wednesday (2 days post delivery).  Pt also states "feels like something is coming out of me".  Report VB heavy, changing pad 1-2 hours.

## 2017-08-17 LAB — GC/CHLAMYDIA PROBE AMP (~~LOC~~) NOT AT ARMC
Chlamydia: NEGATIVE
NEISSERIA GONORRHEA: NEGATIVE

## 2017-10-19 ENCOUNTER — Emergency Department (HOSPITAL_COMMUNITY)
Admission: EM | Admit: 2017-10-19 | Discharge: 2017-10-19 | Disposition: A | Discharge status: Home / Self Care | Payer: Medicaid Other | Attending: Emergency Medicine | Admitting: Emergency Medicine

## 2017-10-19 ENCOUNTER — Encounter (HOSPITAL_COMMUNITY): Payer: Self-pay

## 2017-10-19 ENCOUNTER — Other Ambulatory Visit: Payer: Self-pay

## 2017-10-19 DIAGNOSIS — R42 Dizziness and giddiness: Secondary | ICD-10-CM | POA: Diagnosis present

## 2017-10-19 DIAGNOSIS — E876 Hypokalemia: Secondary | ICD-10-CM | POA: Insufficient documentation

## 2017-10-19 DIAGNOSIS — R002 Palpitations: Secondary | ICD-10-CM | POA: Diagnosis not present

## 2017-10-19 DIAGNOSIS — R112 Nausea with vomiting, unspecified: Secondary | ICD-10-CM | POA: Diagnosis not present

## 2017-10-19 DIAGNOSIS — D649 Anemia, unspecified: Secondary | ICD-10-CM | POA: Diagnosis not present

## 2017-10-19 HISTORY — DX: Complete or unspecified spontaneous abortion without complication: O03.9

## 2017-10-19 LAB — URINALYSIS, ROUTINE W REFLEX MICROSCOPIC
Bilirubin Urine: NEGATIVE
GLUCOSE, UA: NEGATIVE mg/dL
Hgb urine dipstick: NEGATIVE
Ketones, ur: NEGATIVE mg/dL
NITRITE: NEGATIVE
PH: 7 (ref 5.0–8.0)
Protein, ur: NEGATIVE mg/dL
SPECIFIC GRAVITY, URINE: 1.016 (ref 1.005–1.030)

## 2017-10-19 LAB — CBC WITH DIFFERENTIAL/PLATELET
BASOS PCT: 0 %
Basophils Absolute: 0 10*3/uL (ref 0.0–0.1)
EOS ABS: 0.1 10*3/uL (ref 0.0–0.7)
EOS PCT: 1 %
HCT: 30.9 % — ABNORMAL LOW (ref 36.0–46.0)
HEMOGLOBIN: 8.8 g/dL — AB (ref 12.0–15.0)
LYMPHS ABS: 2.3 10*3/uL (ref 0.7–4.0)
Lymphocytes Relative: 38 %
MCH: 19 pg — AB (ref 26.0–34.0)
MCHC: 28.5 g/dL — AB (ref 30.0–36.0)
MCV: 66.6 fL — ABNORMAL LOW (ref 78.0–100.0)
Monocytes Absolute: 0.3 10*3/uL (ref 0.1–1.0)
Monocytes Relative: 6 %
NEUTROS PCT: 55 %
Neutro Abs: 3.3 10*3/uL (ref 1.7–7.7)
PLATELETS: 346 10*3/uL (ref 150–400)
RBC: 4.64 MIL/uL (ref 3.87–5.11)
RDW: 18.1 % — ABNORMAL HIGH (ref 11.5–15.5)
WBC: 6 10*3/uL (ref 4.0–10.5)

## 2017-10-19 LAB — COMPREHENSIVE METABOLIC PANEL
ALBUMIN: 3.5 g/dL (ref 3.5–5.0)
ALT: 10 U/L — AB (ref 14–54)
ANION GAP: 7 (ref 5–15)
AST: 15 U/L (ref 15–41)
Alkaline Phosphatase: 86 U/L (ref 38–126)
BUN: 7 mg/dL (ref 6–20)
CHLORIDE: 107 mmol/L (ref 101–111)
CO2: 27 mmol/L (ref 22–32)
Calcium: 8.4 mg/dL — ABNORMAL LOW (ref 8.9–10.3)
Creatinine, Ser: 0.53 mg/dL (ref 0.44–1.00)
GFR calc non Af Amer: >60 mL/min (ref >60)
GLUCOSE: 83 mg/dL (ref 65–99)
Potassium: 3.1 mmol/L — ABNORMAL LOW (ref 3.5–5.1)
SODIUM: 141 mmol/L (ref 135–145)
Total Bilirubin: 0.8 mg/dL (ref 0.3–1.2)
Total Protein: 6.9 g/dL (ref 6.5–8.1)

## 2017-10-19 LAB — I-STAT BETA HCG BLOOD, ED (MC, WL, AP ONLY): I-stat hCG, quantitative: <5 m[IU]/mL (ref <5)

## 2017-10-19 LAB — I-STAT TROPONIN, ED: Troponin i, poc: 0 ng/mL (ref 0.00–0.08)

## 2017-10-19 LAB — TSH: TSH: 1.415 u[IU]/mL (ref 0.350–4.500)

## 2017-10-19 MED ORDER — ONDANSETRON HCL 4 MG/2ML IJ SOLN
4.0000 mg | Freq: Once | INTRAMUSCULAR | Status: AC
  Administered 2017-10-19: 4 mg via INTRAVENOUS
  Filled 2017-10-19: qty 2

## 2017-10-19 MED ORDER — ONDANSETRON 4 MG PO TBDP: 4.0000 mg | ORAL_TABLET | Freq: Three times a day (TID) | ORAL | 0 refills | Status: DC | PRN

## 2017-10-19 MED ORDER — SODIUM CHLORIDE 0.9 % IV BOLUS (SEPSIS)
2000.0000 mL | Freq: Once | INTRAVENOUS | Status: AC
  Administered 2017-10-19: 2000 mL via INTRAVENOUS

## 2017-10-19 MED ORDER — FERROUS SULFATE 325 (65 FE) MG PO TABS: 325.0000 mg | ORAL_TABLET | Freq: Every day | ORAL | 0 refills | Status: DC

## 2017-10-19 MED ORDER — POTASSIUM CHLORIDE CRYS ER 20 MEQ PO TBCR
60.0000 meq | EXTENDED_RELEASE_TABLET | Freq: Once | ORAL | Status: AC
  Administered 2017-10-19: 60 meq via ORAL
  Filled 2017-10-19: qty 3

## 2017-10-19 NOTE — Discharge Instructions (Signed)
Your labs today showed that you are anemic, which could be contributing to your symptoms, therefore we will be restarting you on iron pills. Take them in the morning with breakfast and a glass of orange juice. These may make you constipated, use over the counter miralax if this occurs; increase water and fiber intake if you get constipated. Your potassium was slightly low, but you were given potassium here; see the list of foods below to find potassium rich foods to help incorporate those into your diet. Use zofran as directed as needed for nausea. Stay well hydrated. Continue all your usual home medications. Follow up with a primary care provider in 1 week for recheck of symptoms and to establish medical care. Return to the ER for emergent changes or worsening symptoms.

## 2017-10-19 NOTE — ED Provider Notes (Signed)
[facility]-EMERGENCY DEPT Provider Note   CSN: [id number] Arrival date & time: 10/19/17  1219     History   Chief Complaint Chief Complaint  Patient presents with  . Near Syncope  . Emesis    HPI Leah Allen is a 21 y.o. female with a PMHx of asthma, hypothyroidism, anemia, and seizures, who presents to the ED with complaints of an episode of lightheadedness, palpitations, nausea, vomiting, and fatigue that occurred around 11 AM.  Patient states that she has had similar episodes during her recent pregnancy as well as since her miscarriage on 08/10/17.  She reports that she had had an argument with her boyfriend about 2 hours prior to the episode, and then became nauseous and vomited 10 times, nonbloody and nonbilious.  She began having palpitations and felt lightheaded, which worsens with standing and improved with laying down.  She states that she felt "very fatigued like her body was shutting down".  She has not been given anything prior to arrival, no treatments tried at home prior to arrival.  LMP was 09/14/17 however she has been spotting for the last 1 week but this is much lighter than her usual menstrual cycle.  She does not have a PCP as she just moved here from [place] in July, however her family goes to [place] so she's planning on establishing care there.  She denies fevers, chills, CP, SOB, abd pain, diarrhea/constipation, obstipation, melena, hematochezia, hematemesis, hematuria, dysuria, vaginal discharge, myalgias, arthralgias, numbness, tingling, focal weakness, LE swelling, estrogen use, or any other complaints at this time. Denies recent travel, sick contacts, suspicious food intake, EtOH use, NSAID use, or prior abd surgeries.    The history is provided by the patient and medical records. No language interpreter was used.  Near Syncope  This is a recurrent problem. The current episode started 3 to 5 hours ago. The problem occurs every several  days (intermittent). The problem has not changed since onset.Pertinent negatives include no chest pain, no abdominal pain and no shortness of breath. The symptoms are aggravated by standing. The symptoms are relieved by lying down. She has tried nothing for the symptoms. The treatment provided no relief.  Emesis   Pertinent negatives include no abdominal pain, no arthralgias, no chills, no diarrhea, no fever and no myalgias.    Past Medical History:  Diagnosis Date  . Asthma   . Hypothyroidism   . Miscarriage   . 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 Thyroid disease     Patient Active Problem List   Diagnosis Date Noted  . Postpartum care following vaginal delivery 08/10/2017  . Rh negative status during pregnancy 08/10/2017    Past Surgical History:  Procedure Laterality Date  . NO PAST SURGERIES      OB History    Gravida Para Term Preterm AB Living   1         0   SAB TAB Ectopic Multiple Live Births                   Home Medications    Prior to Admission medications   Medication Sig Start Date End Date Taking? Authorizing Provider  cephALEXin (KEFLEX) 500 MG capsule Take 1 capsule (500 mg total) by mouth 4 (four) times daily. 08/15/17   Montez Morita, CNM  ferrous sulfate 325 (65 FE) MG tablet Take 1 tablet (325 mg total) by mouth daily. 09/01/16  Muthersbaugh, Dahlia ClientHannah, PA-C  levothyroxine (SYNTHROID, LEVOTHROID) 25 MCG tablet Take 25 mcg by mouth daily before breakfast.    [provider]  oxyCODONE-acetaminophen (ROXICET) 5-325 MG tablet Take 1 tablet by mouth every 4 (four) hours as needed for severe pain. 08/15/17   Montez MoritaLawson, Marie D, CNM  potassium chloride SA (K-DUR,KLOR-CON) 20 MEQ tablet Take 1 tablet (20 mEq total) by mouth daily. 09/01/16   Muthersbaugh, Dahlia ClientHannah, PA-C    Family History Family History  Problem Relation Age of Onset  . Diabetes Father   . Clotting disorder Mother     Social History Social History    Tobacco Use  . Smoking status: Never Smoker  . Smokeless tobacco: Never Used  Substance Use Topics  . Alcohol use: Yes  . Drug use: Yes    Types: Marijuana     Allergies   Sulfa antibiotics   Review of Systems Review of Systems  Constitutional: Positive for fatigue. Negative for chills and fever.  Respiratory: Negative for shortness of breath.   Cardiovascular: Positive for palpitations and near-syncope. Negative for chest pain and leg swelling.  Gastrointestinal: Positive for nausea and vomiting. Negative for abdominal pain, blood in stool, constipation and diarrhea.  Genitourinary: Positive for vaginal bleeding (spotting, due for menses). Negative for dysuria, hematuria and vaginal discharge.  Musculoskeletal: Negative for arthralgias and myalgias.  Skin: Negative for color change.  Allergic/Immunologic: Negative for immunocompromised state.  Neurological: Positive for light-headedness. Negative for dizziness, syncope, weakness and numbness.  Psychiatric/Behavioral: Negative for confusion.   All other systems reviewed and are negative for acute change except as noted in the HPI.    Physical Exam Updated Vital Signs BP 126/75 (BP Location: Left Arm)   Pulse 80   Temp 98.4 F (36.9 C) (Oral)   Resp 16   Ht 5\' 2"  (1.575 m)   Wt 81.6 kg (180 lb)   SpO2 100%   BMI 32.92 kg/m   Physical Exam  Constitutional: She is oriented to person, place, and time. Vital signs are normal. She appears well-developed and well-nourished.  Non-toxic appearance. No distress.  Afebrile, nontoxic, NAD  HENT:  Head: Normocephalic and atraumatic.  Mouth/Throat: Oropharynx is clear and moist. Mucous membranes are dry.  Mildly dry lips  Eyes: Conjunctivae and EOM are normal. Pupils are equal, round, and reactive to light. Right eye exhibits no discharge. Left eye exhibits no discharge.  PERRL, EOMI, no nystagmus, no visual field deficits   Neck: Normal range of motion. Neck supple. No  spinous process tenderness and no muscular tenderness present. No neck rigidity. Normal range of motion present.  FROM intact without spinous process TTP, no bony stepoffs or deformities, no paraspinous muscle TTP or muscle spasms. No rigidity or meningeal signs. No bruising or swelling.   Cardiovascular: Normal rate, regular rhythm, normal heart sounds and intact distal pulses. Exam reveals no gallop and no friction rub.  No murmur heard. HR rises by ~15-20bpm when going from laying to sitting, then another ~30bpm when going from sitting to standing and pt reports lightheadedness during standing.  While laying down in bed, RRR, nl s1/s2, no m/r/g. Distal pulses intact, no pedal edema   Pulmonary/Chest: Effort normal and breath sounds normal. No respiratory distress. She has no decreased breath sounds. She has no wheezes. She has no rhonchi. She has no rales.  Abdominal: Soft. Normal appearance and bowel sounds are normal. She exhibits no distension. There is no tenderness. There is no rigidity, no rebound, no guarding, no CVA  tenderness, no tenderness at McBurney's point and negative Murphy's sign.  Musculoskeletal: Normal range of motion.  MAE x4 Strength and sensation grossly intact in all extremities Distal pulses intact Gait steady  Neurological: She is alert and oriented to person, place, and time. She has normal strength. No cranial nerve deficit or sensory deficit. Coordination and gait normal. GCS eye subscore is 4. GCS verbal subscore is 5. GCS motor subscore is 6.  CN 2-12 grossly intact A&O x4 GCS 15 Sensation and strength intact Gait nonataxic including with tandem walking Coordination with finger-to-nose WNL Neg pronator drift   Skin: Skin is warm, dry and intact. No rash noted.  Psychiatric: She has a normal mood and affect.  Nursing note and vitals reviewed.    ED Treatments / Results  Labs (all labs ordered are listed, but only abnormal results are displayed) Labs  Reviewed  CBC WITH DIFFERENTIAL/PLATELET - Abnormal; Notable for the following components:      Result Value   Hemoglobin 8.8 (*)    HCT 30.9 (*)    MCV 66.6 (*)    MCH 19.0 (*)    MCHC 28.5 (*)    RDW 18.1 (*)    All other components within normal limits  COMPREHENSIVE METABOLIC PANEL - Abnormal; Notable for the following components:   Potassium 3.1 (*)    Calcium 8.4 (*)    ALT 10 (*)    All other components within normal limits  URINALYSIS, ROUTINE W REFLEX MICROSCOPIC - Abnormal; Notable for the following components:   Color, Urine AMBER (*)    Leukocytes, UA TRACE (*)    Bacteria, UA RARE (*)    Squamous Epithelial / LPF 6-30 (*)    All other components within normal limits  TSH  I-STAT TROPONIN, ED  I-STAT BETA HCG BLOOD, ED ([facility], [facility], [facility] ONLY)    EKG  EKG Interpretation  Date/Time:  Monday October 19 2017 12:35:00 EST Ventricular Rate:  92 PR Interval:    QRS Duration: 83 QT Interval:  350 QTC Calculation: 433 R Axis:   22 Text Interpretation:  Sinus rhythm Confirmed by Tilden Fossaees, Elizabeth [phone]([id number]) on 10/19/2017 2:28:43 PM       Radiology No results found.  Procedures Procedures (including critical care time)  Medications Ordered in ED Medications  sodium chloride 0.9 % bolus 2,000 mL (2,000 mLs Intravenous New Bag/Given 10/19/17 1454)  ondansetron (ZOFRAN) injection 4 mg (4 mg Intravenous Given 10/19/17 1455)     Initial Impression / Assessment and Plan / ED Course  I have reviewed the triage vital signs and the nursing notes.  Pertinent labs & imaging results that were available during my care of the patient were reviewed by me and considered in my medical decision making (see chart for details).     21 y.o. female here with n/v, palpitations, fatigue, and lightheadedness with standing that occurred around 11am; pt states she had argued with her boyfriend 2hrs prior to that, and reports that ever since her pregnancy and subsequent miscarriage several  months ago, she's had similar episodes.  On exam, no focal neuro deficits, no abdominal tenderness, when pt stands her HR rises ~30bpm which confirms +orthostatics; mildly dry lips. EKG nonischemic and without acute findings. Will get labs, TSH, and give fluids and zofran then reassess shortly.   3:56 PM CBC w/diff showing stable (actually slightly improving) microcytic anemia with Hgb 8.8 today up from 7.8 two months ago. CMP with mildly low K 3.1, will orally replete here; otherwise  remainder of CMP WNL. BetaHCG neg. Trop neg. TSH WNL. U/A grossly contaminated with 6-30 squamous cells, but without convincing evidence of UTI. Pt's fluids haven't really gone in at all yet, but states nausea is better. Will give Kdur and PO challenge, then recheck after fluids are done to see how she's feeling. Will reassess shortly.   7:16 PM Fluids finally done. Pt feeling much better and tolerating PO well. Pt not on iron supplementation at this time, will restart this for her anemia which could be contributing to why she's been feeling fatigued and had intermittent lightheaded spells. Doubt need for emergent transfusion at this time, however if she drops <8 and continues to have symptoms then she may need one. Advised adequate hydration and rest, will send home with zofran as well, discussed f/up with a PCP provider for recheck of symptoms in 1wk and for ongoing medical care. I explained the diagnosis and have given explicit precautions to return to the ER including for any other new or worsening symptoms. The patient understands and accepts the medical plan as it's been dictated and I have answered their questions. Discharge instructions concerning home care and prescriptions have been given. The patient is STABLE and is discharged to home in good condition.    Final Clinical Impressions(s) / ED Diagnoses   Final diagnoses:  Orthostatic lightheadedness  Nausea and vomiting in adult patient  Palpitations   Hypokalemia  Chronic anemia    ED Discharge Orders        Ordered    ondansetron (ZOFRAN ODT) 4 MG disintegrating tablet  Every 8 hours PRN     10/19/17 1916    ferrous sulfate 325 (65 FE) MG tablet  Daily with breakfast     10/19/17 [address] [place], [place] 10/19/17 1920    Tilden Fossa, MD 10/20/17 [phone]

## 2017-10-19 NOTE — ED Notes (Signed)
Bed: [id number] Expected date:  Expected time:  Means of arrival:  Comments: 21 yo syncope

## 2017-10-19 NOTE — ED Notes (Signed)
IV positional. Have had to remind writer to keep left arm straight in order for fluids to infuse.

## 2018-01-02 ENCOUNTER — Encounter (HOSPITAL_COMMUNITY): Payer: Self-pay | Admitting: *Deleted

## 2018-01-02 ENCOUNTER — Inpatient Hospital Stay (HOSPITAL_COMMUNITY): Payer: Medicaid Other

## 2018-01-02 ENCOUNTER — Observation Stay (HOSPITAL_COMMUNITY)
Admission: AD | Admit: 2018-01-02 | Discharge: 2018-01-03 | Disposition: A | Discharge status: Home / Self Care | Payer: Medicaid Other | Source: Ambulatory Visit | Attending: Internal Medicine | Admitting: Internal Medicine

## 2018-01-02 DIAGNOSIS — R1011 Right upper quadrant pain: Secondary | ICD-10-CM

## 2018-01-02 DIAGNOSIS — R791 Abnormal coagulation profile: Secondary | ICD-10-CM | POA: Insufficient documentation

## 2018-01-02 DIAGNOSIS — I313 Pericardial effusion (noninflammatory): Secondary | ICD-10-CM | POA: Insufficient documentation

## 2018-01-02 DIAGNOSIS — I3139 Other pericardial effusion (noninflammatory): Secondary | ICD-10-CM

## 2018-01-02 DIAGNOSIS — G40909 Epilepsy, unspecified, not intractable, without status epilepticus: Secondary | ICD-10-CM | POA: Insufficient documentation

## 2018-01-02 DIAGNOSIS — M940 Chondrocostal junction syndrome [Tietze]: Principal | ICD-10-CM

## 2018-01-02 DIAGNOSIS — J45909 Unspecified asthma, uncomplicated: Secondary | ICD-10-CM | POA: Insufficient documentation

## 2018-01-02 DIAGNOSIS — R000 Tachycardia, unspecified: Secondary | ICD-10-CM | POA: Insufficient documentation

## 2018-01-02 DIAGNOSIS — Z882 Allergy status to sulfonamides status: Secondary | ICD-10-CM | POA: Insufficient documentation

## 2018-01-02 DIAGNOSIS — E039 Hypothyroidism, unspecified: Secondary | ICD-10-CM | POA: Diagnosis present

## 2018-01-02 DIAGNOSIS — D509 Iron deficiency anemia, unspecified: Secondary | ICD-10-CM | POA: Diagnosis present

## 2018-01-02 DIAGNOSIS — I300 Acute nonspecific idiopathic pericarditis: Secondary | ICD-10-CM

## 2018-01-02 DIAGNOSIS — Z791 Long term (current) use of non-steroidal anti-inflammatories (NSAID): Secondary | ICD-10-CM | POA: Insufficient documentation

## 2018-01-02 DIAGNOSIS — Z79899 Other long term (current) drug therapy: Secondary | ICD-10-CM | POA: Insufficient documentation

## 2018-01-02 LAB — LIPASE, BLOOD: LIPASE: 24 U/L (ref 11–51)

## 2018-01-02 LAB — URINALYSIS, ROUTINE W REFLEX MICROSCOPIC
BACTERIA UA: NONE SEEN
Bilirubin Urine: NEGATIVE
Glucose, UA: NEGATIVE mg/dL
HGB URINE DIPSTICK: NEGATIVE
KETONES UR: NEGATIVE mg/dL
Leukocytes, UA: NEGATIVE
NITRITE: NEGATIVE
Protein, ur: 30 mg/dL — AB
Specific Gravity, Urine: 1.032 — ABNORMAL HIGH (ref 1.005–1.030)
pH: 6 (ref 5.0–8.0)

## 2018-01-02 LAB — CBC WITH DIFFERENTIAL/PLATELET
BAND NEUTROPHILS: 0 %
BASOS PCT: 0 %
Basophils Absolute: 0 10*3/uL (ref 0.0–0.1)
Blasts: 0 %
EOS ABS: 0.3 10*3/uL (ref 0.0–0.7)
Eosinophils Relative: 3 %
HCT: 30.7 % — ABNORMAL LOW (ref 36.0–46.0)
HEMOGLOBIN: 8.9 g/dL — AB (ref 12.0–15.0)
Lymphocytes Relative: 27 %
Lymphs Abs: 2.5 10*3/uL (ref 0.7–4.0)
MCH: 18.5 pg — ABNORMAL LOW (ref 26.0–34.0)
MCHC: 29 g/dL — ABNORMAL LOW (ref 30.0–36.0)
MCV: 63.8 fL — ABNORMAL LOW (ref 78.0–100.0)
MONO ABS: 0.4 10*3/uL (ref 0.1–1.0)
Metamyelocytes Relative: 0 %
Monocytes Relative: 4 %
Myelocytes: 0 %
NEUTROS PCT: 66 %
NRBC: 0 /100{WBCs}
Neutro Abs: 6.1 10*3/uL (ref 1.7–7.7)
Other: 0 %
PROMYELOCYTES ABS: 0 %
Platelets: 403 10*3/uL — ABNORMAL HIGH (ref 150–400)
RBC: 4.81 MIL/uL (ref 3.87–5.11)
RDW: 20.5 % — ABNORMAL HIGH (ref 11.5–15.5)
WBC: 9.3 10*3/uL (ref 4.0–10.5)

## 2018-01-02 LAB — WET PREP, GENITAL
CLUE CELLS WET PREP: NONE SEEN
Sperm: NONE SEEN
Trich, Wet Prep: NONE SEEN
YEAST WET PREP: NONE SEEN

## 2018-01-02 LAB — COMPREHENSIVE METABOLIC PANEL
ALBUMIN: 3.4 g/dL — AB (ref 3.5–5.0)
ALK PHOS: 98 U/L (ref 38–126)
ALT: 11 U/L — AB (ref 14–54)
AST: 18 U/L (ref 15–41)
Anion gap: 7 (ref 5–15)
BUN: 12 mg/dL (ref 6–20)
CO2: 27 mmol/L (ref 22–32)
Calcium: 9 mg/dL (ref 8.9–10.3)
Chloride: 103 mmol/L (ref 101–111)
Creatinine, Ser: 0.65 mg/dL (ref 0.44–1.00)
GFR calc Af Amer: >60 mL/min (ref >60)
GFR calc non Af Amer: >60 mL/min (ref >60)
Glucose, Bld: 107 mg/dL — ABNORMAL HIGH (ref 65–99)
Potassium: 4 mmol/L (ref 3.5–5.1)
SODIUM: 137 mmol/L (ref 135–145)
TOTAL PROTEIN: 7.6 g/dL (ref 6.5–8.1)
Total Bilirubin: 0.7 mg/dL (ref 0.3–1.2)

## 2018-01-02 LAB — POCT PREGNANCY, URINE: PREG TEST UR: NEGATIVE

## 2018-01-02 MED ORDER — GI COCKTAIL ~~LOC~~
30.0000 mL | Freq: Once | ORAL | Status: AC
  Administered 2018-01-02: 30 mL via ORAL
  Filled 2018-01-02: qty 30

## 2018-01-02 MED ORDER — KETOROLAC TROMETHAMINE 60 MG/2ML IM SOLN
60.0000 mg | Freq: Once | INTRAMUSCULAR | Status: AC
  Administered 2018-01-02: 60 mg via INTRAMUSCULAR
  Filled 2018-01-02: qty 2

## 2018-01-02 NOTE — MAU Provider Note (Signed)
History     CSN: [id number]  Arrival date and time: 01/02/18 2123   First Provider Initiated Contact with Patient 01/02/18 2217      Chief Complaint  Patient presents with  . Abdominal Pain   HPI   Ms.Leah Allen is a 22 y.o. female G1P0 here with upper abdominal pain. Says the pain started 3 days ago. Says at 0300 last night the pain was so bad that she almost past out. The pain is sharp and feels stabbing. Says around 11pm prior to bed she ate a salad and some water and doesn't feel like the eating had anything to do with the pain. Says the pain became worse 1 hour after the pain. Says she had chills and a fever or 100.0 orally. Laying down makes the pain worse, sitting improves the pain.  Says she has only taken tylenol for the pain, Which helped some. Says she ate a burrito this morning for breakfast and the pain did not get any worse. States she has to toss and turn around in the bed to try and find a spot where the pain is no terrible. Sexually active with 1 female partner X 5 years.   OB History    Gravida Para Term Preterm AB Living   1         0   SAB TAB Ectopic Multiple Live Births                  Past Medical History:  Diagnosis Date  . Asthma   . Hypothyroidism   . Miscarriage   . 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 Thyroid disease     Past Surgical History:  Procedure Laterality Date  . NO PAST SURGERIES      Family History  Problem Relation Age of Onset  . Diabetes Father   . Clotting disorder Mother     Social History   Tobacco Use  . Smoking status: Never Smoker  . Smokeless tobacco: Never Used  Substance Use Topics  . Alcohol use: Yes  . Drug use: Yes    Types: Marijuana    Allergies:  Allergies  Allergen Reactions  . Sulfa Antibiotics Anaphylaxis and Swelling    Medications Prior to Admission  Medication Sig Dispense Refill Last Dose  . cephALEXin (KEFLEX) 500 MG capsule Take 1 capsule (500 mg total)  by mouth 4 (four) times daily. (Patient not taking: Reported on 10/19/2017) 40 capsule 0 Not Taking at Unknown time  . ferrous sulfate 325 (65 FE) MG tablet Take 1 tablet (325 mg total) by mouth daily. (Patient not taking: Reported on 10/19/2017) 30 tablet 0 Not Taking at Unknown time  . ferrous sulfate 325 (65 FE) MG tablet Take 1 tablet (325 mg total) by mouth daily with breakfast. TAKE WITH ORANGE JUICE 30 tablet 0   . levothyroxine (SYNTHROID, LEVOTHROID) 25 MCG tablet Take 25 mcg by mouth daily before breakfast.   10/19/2017 at Unknown time  . ondansetron (ZOFRAN ODT) 4 MG disintegrating tablet Take 1 tablet (4 mg total) by mouth every 8 (eight) hours as needed for nausea or vomiting. 15 tablet 0   . oxyCODONE-acetaminophen (ROXICET) 5-325 MG tablet Take 1 tablet by mouth every 4 (four) hours as needed for severe pain. (Patient not taking: Reported on 10/19/2017) 20 tablet 0 Not Taking at Unknown time  . potassium chloride SA (K-DUR,KLOR-CON) 20 MEQ tablet Take 1 tablet (20 mEq total) by  mouth daily. (Patient not taking: Reported on 10/19/2017) 7 tablet 0 Not Taking at Unknown time   Results for orders placed or performed during the hospital encounter of 01/02/18 (from the past 48 hour(s))  Urinalysis, Routine w reflex microscopic     Status: Abnormal   Collection Time: 01/02/18  9:31 PM  Result Value Ref Range   Color, Urine YELLOW YELLOW   APPearance HAZY (A) CLEAR   Specific Gravity, Urine 1.032 (H) 1.005 - 1.030   pH 6.0 5.0 - 8.0   Glucose, UA NEGATIVE NEGATIVE mg/dL   Hgb urine dipstick NEGATIVE NEGATIVE   Bilirubin Urine NEGATIVE NEGATIVE   Ketones, ur NEGATIVE NEGATIVE mg/dL   Protein, ur 30 (A) NEGATIVE mg/dL   Nitrite NEGATIVE NEGATIVE   Leukocytes, UA NEGATIVE NEGATIVE   RBC / HPF 0-5 0 - 5 RBC/hpf   WBC, UA 0-5 0 - 5 WBC/hpf   Bacteria, UA NONE SEEN NONE SEEN   Squamous Epithelial / LPF 0-5 (A) NONE SEEN   Mucus PRESENT     Comment: Performed at [facility], [address] [place], [place] [address]  Pregnancy, urine POC     Status: None   Collection Time: 01/02/18  9:47 PM  Result Value Ref Range   Preg Test, Ur NEGATIVE NEGATIVE    Comment:        THE SENSITIVITY OF THIS METHODOLOGY IS >24 mIU/mL   CBC with Differential     Status: Abnormal   Collection Time: 01/02/18 10:32 PM  Result Value Ref Range   WBC 9.3 4.0 - 10.5 K/uL   RBC 4.81 3.87 - 5.11 MIL/uL   Hemoglobin 8.9 (L) 12.0 - 15.0 g/dL   HCT 30.7 (L) 36.0 - 46.0 %   MCV 63.8 (L) 78.0 - 100.0 fL   MCH 18.5 (L) 26.0 - 34.0 pg   MCHC 29.0 (L) 30.0 - 36.0 g/dL   RDW 20.5 (H) 11.5 - 15.5 %   Platelets 403 (H) 150 - 400 K/uL   Neutrophils Relative % 66 %   Lymphocytes Relative 27 %   Monocytes Relative 4 %   Eosinophils Relative 3 %   Basophils Relative 0 %   Band Neutrophils 0 %   Metamyelocytes Relative 0 %   Myelocytes 0 %   Promyelocytes Absolute 0 %   Blasts 0 %   nRBC 0 0 /100 WBC   Other 0 %   Neutro Abs 6.1 1.7 - 7.7 K/uL   Lymphs Abs 2.5 0.7 - 4.0 K/uL   Monocytes Absolute 0.4 0.1 - 1.0 K/uL   Eosinophils Absolute 0.3 0.0 - 0.7 K/uL   Basophils Absolute 0.0 0.0 - 0.1 K/uL   RBC Morphology OVALOCYTES     Comment: TEARDROP CELLS Performed at [facility], [address], [place], [place] [address]   Comprehensive metabolic panel     Status: Abnormal   Collection Time: 01/02/18 10:32 PM  Result Value Ref Range   Sodium 137 135 - 145 mmol/L   Potassium 4.0 3.5 - 5.1 mmol/L   Chloride 103 101 - 111 mmol/L   CO2 27 22 - 32 mmol/L   Glucose, Bld 107 (H) 65 - 99 mg/dL   BUN 12 6 - 20 mg/dL   Creatinine, Ser 0.65 0.44 - 1.00 mg/dL   Calcium 9.0 8.9 - 10.3 mg/dL   Total Protein 7.6 6.5 - 8.1 g/dL   Albumin 3.4 (L) 3.5 - 5.0 g/dL   AST 18 15 - 41 U/L  ALT 11 (L) 14 - 54 U/L   Alkaline Phosphatase 98 38 - 126 U/L   Total Bilirubin 0.7 0.3 - 1.2 mg/dL   GFR calc non Af Amer >60 >60 mL/min   GFR calc Af Amer >60 >60 mL/min    Comment: (NOTE) The eGFR has been  calculated using the CKD EPI equation. This calculation has not been validated in all clinical situations. eGFR's persistently <60 mL/min signify possible Chronic Kidney Disease.    Anion gap 7 5 - 15    Comment: Performed at [facility], [address] [place], [place] [address]  Lipase, blood     Status: None   Collection Time: 01/02/18 10:32 PM  Result Value Ref Range   Lipase 24 11 - 51 U/L    Comment: Performed at [facility], [address] [place], [place] [address]  Wet prep, genital     Status: Abnormal   Collection Time: 01/02/18 11:20 PM  Result Value Ref Range   Yeast Wet Prep HPF POC NONE SEEN NONE SEEN   Trich, Wet Prep NONE SEEN NONE SEEN   Clue Cells Wet Prep HPF POC NONE SEEN NONE SEEN   WBC, Wet Prep HPF POC MODERATE (A) NONE SEEN    Comment: FEW BACTERIA SEEN   Sperm NONE SEEN     Comment: Performed at [facility], [address], [place],  [address]   US Abdomen Limited Ruq  Result Date: 01/02/2018 CLINICAL DATA:  Right upper quadrant pain EXAM: ULTRASOUND ABDOMEN LIMITED RIGHT UPPER QUADRANT COMPARISON:  None. FINDINGS: Gallbladder: No gallstones or wall thickening visualized. No sonographic Murphy sign noted by sonographer. Common bile duct: Diameter: 4 mm Liver: No focal lesion identified. Within normal limits in parenchymal echogenicity. Portal vein is patent on color Doppler imaging with normal direction of blood flow towards the liver. Incidentally noted is a small amount of pericardial effusion. IMPRESSION: 1. Negative for gallstones or biliary dilatation 2. Small amount of pericardial effusion Electronically Signed   By: Donavan Foil M.D.   On: 01/02/2018 23:56   Review of Systems  Constitutional: Negative for fever.  Gastrointestinal: Positive for abdominal pain and nausea. Negative for vomiting.   Physical Exam   Blood pressure 134/85, pulse (!) 118, temperature 98.7 F (37.1 C), temperature source Oral, resp. rate 20, height 5'  2" (1.575 m), weight 244 lb (110.7 kg), SpO2 97 %, unknown if currently breastfeeding.  Physical Exam  Constitutional: She is oriented to person, place, and time. She appears well-developed and well-nourished. No distress.  HENT:  Head: Normocephalic.  Eyes: Pupils are equal, round, and reactive to light.  GI: Soft. Normal appearance. There is tenderness in the right upper quadrant and epigastric area. There is guarding. There is no rigidity and no rebound.  Genitourinary:  Genitourinary Comments: Bimanual exam: Cervix closed, anterior, mild CMT Uterus non tender, normal size Adnexa non tender, no masses bilaterally GC/Chlam, wet prep done Chaperone present for exam.   Musculoskeletal: Normal range of motion.  Neurological: She is alert and oriented to person, place, and time.  Skin: Skin is warm. She is not diaphoretic.  Psychiatric: Her behavior is normal.   MAU Course  Procedures  None  MDM  Toradol 60 mg IM Gi Cocktail given  Lipase, CMP & CMP RUQ [place] Patient rates her pain 10/10; following medication patient rates her pain 6/10. US shows an incidental finding of pericardial effusion; dicussed findings with Dr. Tyrone Nine at [facility] ED who recommends the patient be transferred  there for admission.  Discussed the results in detail with the patient.    Assessment and Plan   A:  1. Pericardial effusion   2. Acute abdominal pain in right upper quadrant     P:  Transfer to [facility] ED per Dr. Tyrone Nine Patient agreeable with plan of care  Juana Haralson, Artist Pais, NP 01/03/2018 12:16 AM

## 2018-01-02 NOTE — MAU Note (Addendum)
Had 19wk loss in October, has been having abdominal pain X 2 wks, almost past out yesterday and her" body felt like it was about to shut down"

## 2018-01-03 ENCOUNTER — Observation Stay (HOSPITAL_COMMUNITY): Payer: Medicaid Other

## 2018-01-03 ENCOUNTER — Inpatient Hospital Stay (HOSPITAL_COMMUNITY): Payer: Medicaid Other

## 2018-01-03 ENCOUNTER — Other Ambulatory Visit: Payer: Self-pay

## 2018-01-03 ENCOUNTER — Observation Stay (HOSPITAL_BASED_OUTPATIENT_CLINIC_OR_DEPARTMENT_OTHER): Payer: Medicaid Other

## 2018-01-03 ENCOUNTER — Encounter (HOSPITAL_COMMUNITY): Payer: Self-pay | Admitting: Radiology

## 2018-01-03 DIAGNOSIS — I309 Acute pericarditis, unspecified: Secondary | ICD-10-CM

## 2018-01-03 DIAGNOSIS — D509 Iron deficiency anemia, unspecified: Secondary | ICD-10-CM | POA: Diagnosis not present

## 2018-01-03 DIAGNOSIS — R1011 Right upper quadrant pain: Secondary | ICD-10-CM | POA: Insufficient documentation

## 2018-01-03 DIAGNOSIS — M940 Chondrocostal junction syndrome [Tietze]: Secondary | ICD-10-CM

## 2018-01-03 DIAGNOSIS — E039 Hypothyroidism, unspecified: Secondary | ICD-10-CM | POA: Diagnosis present

## 2018-01-03 DIAGNOSIS — I319 Disease of pericardium, unspecified: Secondary | ICD-10-CM | POA: Insufficient documentation

## 2018-01-03 LAB — TSH: TSH: 1.872 u[IU]/mL (ref 0.350–4.500)

## 2018-01-03 LAB — D-DIMER, QUANTITATIVE: D-Dimer, Quant: 0.82 ug/mL-FEU — ABNORMAL HIGH (ref 0.00–0.50)

## 2018-01-03 LAB — C-REACTIVE PROTEIN: CRP: 5 mg/dL — AB (ref <1.0)

## 2018-01-03 LAB — ECHOCARDIOGRAM COMPLETE
Height: 62 in
WEIGHTICAEL: 3868.8 [oz_av]

## 2018-01-03 LAB — HIV ANTIBODY (ROUTINE TESTING W REFLEX): HIV SCREEN 4TH GENERATION: NONREACTIVE

## 2018-01-03 LAB — SEDIMENTATION RATE: Sed Rate: 40 mm/hr — ABNORMAL HIGH (ref 0–22)

## 2018-01-03 LAB — I-STAT TROPONIN, ED: Troponin i, poc: 0 ng/mL (ref 0.00–0.08)

## 2018-01-03 MED ORDER — IOPAMIDOL (ISOVUE-370) INJECTION 76%
INTRAVENOUS | Status: AC
  Administered 2018-01-03: 80 mL
  Filled 2018-01-03: qty 100

## 2018-01-03 MED ORDER — COLCHICINE 0.6 MG PO TABS
0.6000 mg | ORAL_TABLET | Freq: Two times a day (BID) | ORAL | Status: DC
  Administered 2018-01-03: 0.6 mg via ORAL
  Filled 2018-01-03: qty 1

## 2018-01-03 MED ORDER — ENOXAPARIN SODIUM 40 MG/0.4ML ~~LOC~~ SOLN: 40.0000 mg | SUBCUTANEOUS | Status: DC

## 2018-01-03 MED ORDER — ACETAMINOPHEN 650 MG RE SUPP: 650.0000 mg | Freq: Four times a day (QID) | RECTAL | Status: DC | PRN

## 2018-01-03 MED ORDER — OMEPRAZOLE 20 MG PO CPDR: 20.0000 mg | DELAYED_RELEASE_CAPSULE | Freq: Every day | ORAL | 0 refills | Status: AC

## 2018-01-03 MED ORDER — FERROUS SULFATE 325 (65 FE) MG PO TABS: 325.0000 mg | ORAL_TABLET | Freq: Two times a day (BID) | ORAL | 0 refills | Status: AC

## 2018-01-03 MED ORDER — ZOLPIDEM TARTRATE 5 MG PO TABS: 5.0000 mg | ORAL_TABLET | Freq: Every evening | ORAL | Status: DC | PRN

## 2018-01-03 MED ORDER — OXYCODONE-ACETAMINOPHEN 5-325 MG PO TABS: 1.0000 | ORAL_TABLET | ORAL | Status: DC | PRN

## 2018-01-03 MED ORDER — LEVOTHYROXINE SODIUM 25 MCG PO TABS
25.0000 ug | ORAL_TABLET | Freq: Every day | ORAL | Status: DC
  Administered 2018-01-03: 25 ug via ORAL
  Filled 2018-01-03: qty 1

## 2018-01-03 MED ORDER — IBUPROFEN 800 MG PO TABS: 800.0000 mg | ORAL_TABLET | Freq: Three times a day (TID) | ORAL | 0 refills | Status: AC

## 2018-01-03 MED ORDER — FERROUS SULFATE 325 (65 FE) MG PO TABS
325.0000 mg | ORAL_TABLET | Freq: Two times a day (BID) | ORAL | Status: DC
  Administered 2018-01-03 (×2): 325 mg via ORAL
  Filled 2018-01-03 (×2): qty 1

## 2018-01-03 MED ORDER — IBUPROFEN 600 MG PO TABS
800.0000 mg | ORAL_TABLET | Freq: Three times a day (TID) | ORAL | Status: DC
  Administered 2018-01-03 (×2): 800 mg via ORAL
  Filled 2018-01-03 (×2): qty 1

## 2018-01-03 MED ORDER — ONDANSETRON HCL 4 MG/2ML IJ SOLN: 4.0000 mg | Freq: Four times a day (QID) | INTRAMUSCULAR | Status: DC | PRN

## 2018-01-03 MED ORDER — ACETAMINOPHEN 325 MG PO TABS
650.0000 mg | ORAL_TABLET | Freq: Four times a day (QID) | ORAL | Status: DC | PRN
  Administered 2018-01-03 (×2): 650 mg via ORAL
  Filled 2018-01-03: qty 2

## 2018-01-03 MED ORDER — PANTOPRAZOLE SODIUM 40 MG PO TBEC
40.0000 mg | DELAYED_RELEASE_TABLET | Freq: Every day | ORAL | Status: DC
  Administered 2018-01-03: 40 mg via ORAL
  Filled 2018-01-03: qty 1

## 2018-01-03 MED ORDER — ONDANSETRON HCL 4 MG PO TABS: 4.0000 mg | ORAL_TABLET | Freq: Four times a day (QID) | ORAL | Status: DC | PRN

## 2018-01-03 NOTE — H&P (Signed)
History and Physical    [name]:[id number] DOB: [birth year] DOA: 01/02/2018  Referring MD/NP/PA:   PCP: Patient, No Pcp Per   Patient coming from:  The patient is coming from home.  At baseline, pt is independent for most of ADL.   Chief Complaint: Abdominal pain  HPI: Leah Allen is a 22 y.o. female with medical history significant of asthma, hypothyroidism, childhood seizure, iron deficiency anemia, who presents with abdominal pain.  Patient states that she has been having right upper quadrant abdominal pain for 3 days, which is constant, sharp, 10 out of 10 in severity, nonradiating. It is aggravated by laying flat, and alleviated by sitting up. Patient has chills, but no fever. Denies CP, cough or shortness of breath. No recent viral infection symptoms. Patient denies nausea, vomiting, diarrhea, abdominal pain, symptoms of UTI or unilateral weakness. Pt went to [facility] and had right upper quadrant US done, which showed no gallstone, but showed small pericardial effusion.  ED Course: pt was found to have WBC 10.3, negative troponin, lipase normal, liver function normal. negative pregnancy test, negative urinalysis, negative wet prep, temperature normal, tachycardia, tachypnea, O2 sat 96% on room air, negative chest x-ray. Patient is placed on telemetry bed for observation. ED physician consulted cardiology, Dr. Skip Estimable.   Review of Systems:   General: no fevers, has chills, no body weight gain, has fatigue HEENT: no blurry vision, hearing changes or sore throat Respiratory: no dyspnea, coughing, wheezing CV: no chest pain, no palpitations GI: no nausea, vomiting, has abdominal pain, no diarrhea, constipation GU: no dysuria, burning on urination, increased urinary frequency, hematuria  Ext: no leg edema Neuro: no unilateral weakness, numbness, or tingling, no vision change or hearing loss Skin: no rash, no skin tear. MSK: No muscle spasm, no deformity, no  limitation of range of movement in spin Heme: No easy bruising.  Travel history: No recent long distant travel.  Allergy:  Allergies  Allergen Reactions  . Sulfa Antibiotics Anaphylaxis and Swelling    Past Medical History:  Diagnosis Date  . Asthma   . Hypothyroidism   . Miscarriage   . 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 Thyroid disease     Past Surgical History:  Procedure Laterality Date  . NO PAST SURGERIES      Social History:  reports that  has never smoked. she has never used smokeless tobacco. She reports that she drinks alcohol. She reports that she uses drugs. Drug: Marijuana.  Family History:  Family History  Problem Relation Age of Onset  . Diabetes Father   . Clotting disorder Mother      Prior to Admission medications   Medication Sig Start Date End Date Taking? Authorizing Provider  levothyroxine (SYNTHROID, LEVOTHROID) 25 MCG tablet Take 25 mcg by mouth daily before breakfast.   Yes [provider]  cephALEXin (KEFLEX) 500 MG capsule Take 1 capsule (500 mg total) by mouth 4 (four) times daily. Patient not taking: Reported on 10/19/2017 08/15/17   Keitha Butte, CNM  ferrous sulfate 325 (65 FE) MG tablet Take 1 tablet (325 mg total) by mouth daily. Patient not taking: Reported on 10/19/2017 09/01/16   Muthersbaugh, Jarrett Soho, PA-C  ferrous sulfate 325 (65 FE) MG tablet Take 1 tablet (325 mg total) by mouth daily with breakfast. TAKE WITH ORANGE JUICE Patient not taking: Reported on 01/03/2018 10/19/17   Street, Tony, PA-C  ondansetron (ZOFRAN ODT) 4 MG disintegrating tablet  Take 1 tablet (4 mg total) by mouth every 8 (eight) hours as needed for nausea or vomiting. Patient not taking: Reported on 01/03/2018 10/19/17   Street, B[place], PA-C  oxyCODONE-acetaminophen (ROXICET) 5-325 MG tablet Take 1 tablet by mouth every 4 (four) hours as needed for severe pain. Patient not taking: Reported on 10/19/2017 08/15/17   Keitha Butte, CNM  potassium chloride SA (K-DUR,KLOR-CON) 20 MEQ tablet Take 1 tablet (20 mEq total) by mouth daily. Patient not taking: Reported on 10/19/2017 09/01/16   MuthersbaughJarrett Soho, PA-C    Physical Exam: Vitals:   01/03/18 0400 01/03/18 0415 01/03/18 0430 01/03/18 0500  BP: 128/76 139/87 134/71 129/86  Pulse: 99 (!) 107 (!) 103 (!) 112  Resp: 14 (!) 22 16 (!) 0  Temp:      TempSrc:      SpO2: 100% 100% 100% 100%  Weight:      Height:       General: Not in acute distress HEENT:       Eyes: PERRL, EOMI, no scleral icterus.       ENT: No discharge from the ears and nose, no pharynx injection, no tonsillar enlargement.        Neck: No JVD, no bruit, no mass felt. Heme: No neck lymph node enlargement. Cardiac: S1/S2, RRR, No murmurs, No gallops or rubs. Respiratory:No rales, wheezing, rhonchi or rubs. GI: Soft, nondistended, has tenderness in RUQ, no rebound pain, no organomegaly, BS present. GU: No hematuria Ext: No pitting leg edema bilaterally. 2+DP/PT pulse bilaterally. Musculoskeletal: No joint deformities, No joint redness or warmth, no limitation of ROM in spin. Skin: No rashes.  Neuro: Alert, oriented X3, cranial nerves II-XII grossly intact, moves all extremities normally.  Psych: Patient is not psychotic, no suicidal or hemocidal ideation.  Labs on Admission: I have personally reviewed following labs and imaging studies  CBC: Recent Labs  Lab 01/02/18 2232  WBC 9.3  NEUTROABS 6.1  HGB 8.9*  HCT 30.7*  MCV 63.8*  PLT 734*   Basic Metabolic Panel: Recent Labs  Lab 01/02/18 2232  NA 137  K 4.0  CL 103  CO2 27  GLUCOSE 107*  BUN 12  CREATININE 0.65  CALCIUM 9.0   GFR: Estimated Creatinine Clearance: 130.5 mL/min (by C-G formula based on SCr of 0.65 mg/dL). Liver Function Tests: Recent Labs  Lab 01/02/18 2232  AST 18  ALT 11*  ALKPHOS 98  BILITOT 0.7  PROT 7.6  ALBUMIN 3.4*   Recent Labs  Lab 01/02/18 2232  LIPASE 24   No results for  input(s): AMMONIA in the last 168 hours. Coagulation Profile: No results for input(s): INR, PROTIME in the last 168 hours. Cardiac Enzymes: No results for input(s): CKTOTAL, CKMB, CKMBINDEX, TROPONINI in the last 168 hours. BNP (last 3 results) No results for input(s): PROBNP in the last 8760 hours. HbA1C: No results for input(s): HGBA1C in the last 72 hours. CBG: No results for input(s): GLUCAP in the last 168 hours. Lipid Profile: No results for input(s): CHOL, HDL, LDLCALC, TRIG, CHOLHDL, LDLDIRECT in the last 72 hours. Thyroid Function Tests: No results for input(s): TSH, T4TOTAL, FREET4, T3FREE, THYROIDAB in the last 72 hours. Anemia Panel: No results for input(s): VITAMINB12, FOLATE, FERRITIN, TIBC, IRON, RETICCTPCT in the last 72 hours. Urine analysis:    Component Value Date/Time   COLORURINE YELLOW 01/02/2018 2131   APPEARANCEUR HAZY (A) 01/02/2018 2131   LABSPEC 1.032 (H) 01/02/2018 2131   PHURINE 6.0 01/02/2018 2131  GLUCOSEU NEGATIVE 01/02/2018 2131   HGBUR NEGATIVE 01/02/2018 2131   [place] NEGATIVE 01/02/2018 2131   [place] NEGATIVE 01/02/2018 2131   PROTEINUR 30 (A) 01/02/2018 2131   NITRITE NEGATIVE 01/02/2018 2131   LEUKOCYTESUR NEGATIVE 01/02/2018 2131   Sepsis Labs: @LABRCNTIP (procalcitonin:4,lacticidven:4) ) Recent Results (from the past 240 hour(s))  Wet prep, genital     Status: Abnormal   Collection Time: 01/02/18 11:20 PM  Result Value Ref Range Status   Yeast Wet Prep HPF POC NONE SEEN NONE SEEN Final   Trich, Wet Prep NONE SEEN NONE SEEN Final   Clue Cells Wet Prep HPF POC NONE SEEN NONE SEEN Final   WBC, Wet Prep HPF POC MODERATE (A) NONE SEEN Final    Comment: FEW BACTERIA SEEN   Sperm NONE SEEN  Final    Comment: Performed at [facility], [address] [place], [place] [address]     Radiological Exams on Admission: Dg Chest 2 View  Result Date: 01/03/2018 CLINICAL DATA:  Subacute onset of generalized abdominal pain. Chest  pain. EXAM: CHEST - 2 VIEW COMPARISON:  None. FINDINGS: The lungs are well-aerated and clear. There is no evidence of focal opacification, pleural effusion or pneumothorax. The heart is normal in size; the mediastinal contour is within normal limits. No acute osseous abnormalities are seen. IMPRESSION: No acute cardiopulmonary process seen. Electronically Signed   By: Garald Balding M.D.   On: 01/03/2018 03:10   US Abdomen Limited Ruq  Result Date: 01/02/2018 CLINICAL DATA:  Right upper quadrant pain EXAM: ULTRASOUND ABDOMEN LIMITED RIGHT UPPER QUADRANT COMPARISON:  None. FINDINGS: Gallbladder: No gallstones or wall thickening visualized. No sonographic Murphy sign noted by sonographer. Common bile duct: Diameter: 4 mm Liver: No focal lesion identified. Within normal limits in parenchymal echogenicity. Portal vein is patent on color Doppler imaging with normal direction of blood flow towards the liver. Incidentally noted is a small amount of pericardial effusion. IMPRESSION: 1. Negative for gallstones or biliary dilatation 2. Small amount of pericardial effusion Electronically Signed   By: Donavan Foil M.D.   On: 01/02/2018 23:56     EKG: Independently reviewed.  Sinus rhythm, QTC 440, early R-wave progression, LAE  Assessment/Plan Principal Problem:   Pericarditis Active Problems:   Hypothyroidism   Iron deficiency anemia   Pericarditis: Patient's right upper quadrant abdominal pain is most likely due to pericarditis referring pain. Patient's lipase normal, liver function normal. [place] did not show abnormalities of the gallbladder, but showed small amount for pericardial effusion. ED physician consulted the cardiology, Dr. Skip Estimable, who "recommended that orthostatic vital signs be performed and if positive the patient should be admitted to the hospitalist for an inpatient echocardiogram".  The patient upon standing heart rate went into the 140s.    -will place on tele be for obs 1. Colchicine 0.6 mg  BID 2. Ibuprofen 847m TID  3. start Omeprazole due to high dose of ibuprofen use 4. 2d echo 5. ESR/CRP  Hypothyroidism: Last TSH was 1.415 on 10/19/18 -Continue home Synthroid  Iron deficiency anemia: pt states that she is taking iron supplement twice a day. Hemoglobin stable. 8.9, which was 8.8 on 10/19/17 -Continue ferrous sulfate 325 mg twice a day   DVT ppx: SQ Lovenox Code Status: Full code Family Communication: None at bed side.    Disposition Plan:  Anticipate discharge back to previous home environment Consults called:  Card, dr. GSkip EstimableAdmission status: Obs / tele   Date of Service 01/03/2018    XIvor Costa  [facility] Pager [phone]  If 7PM-7AM, please contact night-coverage [email or web address] Password [email or web address] 01/03/2018, 5:40 AM

## 2018-01-03 NOTE — Discharge Summary (Addendum)
Physician Discharge Summary  MAIMOUNA RONDEAU ZOX:[id number] DOB: 01-Sep-1996 DOA: 01/02/2018  PCP: Patient, No Pcp Per  Admit date: 01/02/2018 Discharge date: 01/03/2018  Admitted From: home  Disposition:  home  Recommendations for Outpatient Follow-up:  1. Follow up with PCP in 1 week [facility]) 2. F/u GC/Ch testing   Home Health:  none  Equipment/Devices:  none  Discharge Condition:  Stable, improved CODE STATUS:  Full code  Diet recommendation:  regular   Brief/Interim Summary:  The patient is a 22 year old female with asthma, hypothyroidism, epilepsy, iron deficiency anemia who presented with right upper quadrant abdominal pain.  Right upper quadrant ultrasound demonstrated no evidence of gallstones but did show a small pericardial effusion.  White blood cell count was 10.3 with a negative troponin.   ECHO demonstrated normal systolic function, no valvular abnormalities, no pericardial effusion and her ECG demonstrated no ST segment elevations.  Although her admitting diagnosis was pericarditis, I have low suspicion that this is the cause of her pain.  She has a family history of factor V Leiden and her d-dimer was elevated.  CT angios chest, however, was negative for PE.  There were no other obvious findings on CT to explain her pain.  She has point tenderness on the right lower ribs more consistent with costochondritis.    Discharge Diagnoses:  Principal Problem:   Costochondritis, acute Active Problems:   Hypothyroidism   Iron deficiency anemia  Costochondritis.   -  Finally we are awaiting GC/Chlamydia PCR data to exclude Fitzhugh Curtis syndrome - High dose ibuprofen x 7 days -  Omeprazole (due to anemia and NSAID use) -  F/u with PCP in 1 week  Sinus tachycardia, appears to be a chronic issue according to patient -TSH within normal limits -no PE - consider referral to cardiology for sinus tachycardia at discretion of PCP if tachycardia does not correct with  correction of anemia  Hypothyroidism, TSH within normal limits, continue Synthroid  Iron deficiency anemia, still microcytic, hemoglobin at her baseline of around 8 g/dL.  Resume iron supplementation.  We did not confirm iron deficiency.  Ddx includes thalassemia but defer to outpatient provider.    Discharge Instructions   Allergies as of 01/03/2018      Reactions   Sulfa Antibiotics Anaphylaxis, Swelling      Medication List    STOP taking these medications   cephALEXin 500 MG capsule Commonly known as:  KEFLEX   ondansetron 4 MG disintegrating tablet Commonly known as:  ZOFRAN ODT   oxyCODONE-acetaminophen 5-325 MG tablet Commonly known as:  ROXICET   potassium chloride SA 20 MEQ tablet Commonly known as:  K-DUR,KLOR-CON     TAKE these medications   ferrous sulfate 325 (65 FE) MG tablet Take 1 tablet (325 mg total) by mouth 2 (two) times daily with a meal. Start taking on:  01/04/2018 What changed:    when to take this  Another medication with the same name was removed. Continue taking this medication, and follow the directions you see here.   ibuprofen 800 MG tablet Commonly known as:  ADVIL,MOTRIN Take 1 tablet (800 mg total) by mouth 3 (three) times daily.   levothyroxine 25 MCG tablet Commonly known as:  SYNTHROID, LEVOTHROID Take 25 mcg by mouth daily before breakfast.   omeprazole 20 MG capsule Commonly known as:  PRILOSEC Take 1 capsule (20 mg total) by mouth daily.       Allergies  Allergen Reactions  . Sulfa Antibiotics Anaphylaxis and Swelling  Consultations: none   Procedures/Studies: Dg Chest 2 View  Result Date: 01/03/2018 CLINICAL DATA:  Subacute onset of generalized abdominal pain. Chest pain. EXAM: CHEST - 2 VIEW COMPARISON:  None. FINDINGS: The lungs are well-aerated and clear. There is no evidence of focal opacification, pleural effusion or pneumothorax. The heart is normal in size; the mediastinal contour is within normal  limits. No acute osseous abnormalities are seen. IMPRESSION: No acute cardiopulmonary process seen. Electronically Signed   By: Roanna RaiderJeffery  Chang M.D.   On: 01/03/2018 03:10   Ct Angio Chest Pe W Or Wo Contrast  Result Date: 01/03/2018 CLINICAL DATA:  Elevated D-dimer. Chest pain or shortness of breath. Right upper quadrant pain. EXAM: CT ANGIOGRAPHY CHEST WITH CONTRAST TECHNIQUE: Multidetector CT imaging of the chest was performed using the standard protocol during bolus administration of intravenous contrast. Multiplanar CT image reconstructions and MIPs were obtained to evaluate the vascular anatomy. CONTRAST:  80mL ISOVUE-370 IOPAMIDOL (ISOVUE-370) INJECTION 76% COMPARISON:  None. FINDINGS: Cardiovascular: There is no pulmonary embolism identified within the main, lobar or segmental pulmonary arteries bilaterally. Heart size is within normal limits. No pericardial effusion seen. No thoracic aortic aneurysm or evidence of aortic dissection. Mediastinum/Nodes: No mass or enlarged lymph nodes seen within the mediastinum or perihilar regions. Normal residual thymic tissue within the anterior mediastinum. Esophagus appears normal. Trachea and central bronchi are unremarkable. Lungs/Pleura: Lungs are clear.  No pleural effusion or pneumothorax. Upper Abdomen: Limited images of the upper abdomen are unremarkable. Musculoskeletal: No chest wall abnormality. No acute or significant osseous findings. Review of the MIP images confirms the above findings. IMPRESSION: Normal exam.  No pulmonary embolism.  Lungs are clear. Electronically Signed   By: Bary RichardStan  Maynard M.D.   On: 01/03/2018 13:37   [place] Abdomen Limited Ruq  Result Date: 01/02/2018 CLINICAL DATA:  Right upper quadrant pain EXAM: ULTRASOUND ABDOMEN LIMITED RIGHT UPPER QUADRANT COMPARISON:  None. FINDINGS: Gallbladder: No gallstones or wall thickening visualized. No sonographic Murphy sign noted by sonographer. Common bile duct: Diameter: 4 mm Liver: No focal  lesion identified. Within normal limits in parenchymal echogenicity. Portal vein is patent on color Doppler imaging with normal direction of blood flow towards the liver. Incidentally noted is a small amount of pericardial effusion. IMPRESSION: 1. Negative for gallstones or biliary dilatation 2. Small amount of pericardial effusion Electronically Signed   By: Amandajo PangKim  Fujinaga M.D.   On: 01/02/2018 23:56    Subjective:  Still having some pain in the right upper quadrant, but better with tylenol and ibuprofen.  No longer feeling SOB.  Denies nausea, vomiting, diaphoresis.    Discharge Exam: Vitals:   01/03/18 0648 01/03/18 1455  BP: (!) 132/91 120/75  Pulse: (!) 125 (!) 103  Resp: 18 18  Temp: 98.2 F (36.8 C) 98.4 F (36.9 C)  SpO2: 100% 100%   Vitals:   01/03/18 0430 01/03/18 0500 01/03/18 0648 01/03/18 1455  BP: 134/71 129/86 (!) 132/91 120/75  Pulse: (!) 103 (!) 112 (!) 125 (!) 103  Resp: 16 (!) 0 18 18  Temp:   98.2 F (36.8 C) 98.4 F (36.9 C)  TempSrc:   Oral Oral  SpO2: 100% 100% 100% 100%  Weight:   109.7 kg (241 lb 12.8 oz)   Height:   5\' 2"  (1.575 m)     General: Pt is alert, awake, not in acute distress Cardiovascular: mild tachycardia, normal S1/S2 +, no rubs, no gallops Respiratory: CTA bilaterally, no wheezing, no rhonchi Abdominal: Soft, NT, ND, bowel  sounds + Extremities: no edema, no cyanosis MSK:  TTP over the right ribs a few centimeters lateral to the sternal border   The results of significant diagnostics from this hospitalization (including imaging, microbiology, ancillary and laboratory) are listed below for reference.     Microbiology: Recent Results (from the past 240 hour(s))  Wet prep, genital     Status: Abnormal   Collection Time: 01/02/18 11:20 PM  Result Value Ref Range Status   Yeast Wet Prep HPF POC NONE SEEN NONE SEEN Final   Trich, Wet Prep NONE SEEN NONE SEEN Final   Clue Cells Wet Prep HPF POC NONE SEEN NONE SEEN Final   WBC, Wet  Prep HPF POC MODERATE (A) NONE SEEN Final    Comment: FEW BACTERIA SEEN   Sperm NONE SEEN  Final    Comment: Performed at [facility], [address] [place], [place] [address]     Labs: BNP (last 3 results) No results for input(s): BNP in the last 8760 hours. Basic Metabolic Panel: Recent Labs  Lab 01/02/18 2232  NA 137  K 4.0  CL 103  CO2 27  GLUCOSE 107*  BUN 12  CREATININE 0.65  CALCIUM 9.0   Liver Function Tests: Recent Labs  Lab 01/02/18 2232  AST 18  ALT 11*  ALKPHOS 98  BILITOT 0.7  PROT 7.6  ALBUMIN 3.4*   Recent Labs  Lab 01/02/18 2232  LIPASE 24   No results for input(s): AMMONIA in the last 168 hours. CBC: Recent Labs  Lab 01/02/18 2232  WBC 9.3  NEUTROABS 6.1  HGB 8.9*  HCT 30.7*  MCV 63.8*  PLT 403*   Cardiac Enzymes: No results for input(s): CKTOTAL, CKMB, CKMBINDEX, TROPONINI in the last 168 hours. BNP: Invalid input(s): POCBNP CBG: No results for input(s): GLUCAP in the last 168 hours. D-Dimer Recent Labs    01/03/18 0733  DDIMER 0.82*   Hgb A1c No results for input(s): HGBA1C in the last 72 hours. Lipid Profile No results for input(s): CHOL, HDL, LDLCALC, TRIG, CHOLHDL, LDLDIRECT in the last 72 hours. Thyroid function studies Recent Labs    01/03/18 0733  TSH 1.872   Anemia work up No results for input(s): VITAMINB12, FOLATE, FERRITIN, TIBC, IRON, RETICCTPCT in the last 72 hours. Urinalysis    Component Value Date/Time   COLORURINE YELLOW 01/02/2018 2131   APPEARANCEUR HAZY (A) 01/02/2018 2131   LABSPEC 1.032 (H) 01/02/2018 2131   PHURINE 6.0 01/02/2018 2131   GLUCOSEU NEGATIVE 01/02/2018 2131   HGBUR NEGATIVE 01/02/2018 2131   BILIRUBINUR NEGATIVE 01/02/2018 2131   KETONESUR NEGATIVE 01/02/2018 2131   PROTEINUR 30 (A) 01/02/2018 2131   NITRITE NEGATIVE 01/02/2018 2131   LEUKOCYTESUR NEGATIVE 01/02/2018 2131   Sepsis Labs Invalid input(s): PROCALCITONIN,  WBC,  LACTICIDVEN   Time coordinating  discharge: Over 30 minutes  SIGNED:   Renae Fickle, MD  [facility] 01/03/2018, 6:49 PM Pager   If 7PM-7AM, please contact night-coverage [email or web address] Password [email or web address]

## 2018-01-03 NOTE — ED Notes (Signed)
Patient transported to X-ray 

## 2018-01-03 NOTE — ED Notes (Signed)
ED Provider at bedside. 

## 2018-01-03 NOTE — Progress Notes (Signed)
Patient is discharged home. IV and tele monitor has been removed. Patients is going home via spouse.    Elsie Lincolnaven Rikki Smestad, RN

## 2018-01-03 NOTE — Progress Notes (Signed)
PROGRESS NOTE  Leah Allen  MVH:[id number] DOB: December 24, 1995 DOA: 01/02/2018 PCP: Patient, No Pcp Per  Brief Narrative:   Vision is a 22 year old female with asthma, hypothyroidism, epilepsy, iron deficiency anemia who presented with right upper quadrant abdominal pain.  Right upper quadrant ultrasound demonstrated no evidence of gallstones but did show a small pericardial effusion.  White blood cell count was 10.3 with a negative troponin.  Workup is ongoing for the etiology of her right upper quadrant pain which is pleuritic and positional.  She has a family history of factor V Leiden and her d-dimer is elevated.  CT angios chest is pending.  Although there was initially suspicion for pericarditis and this is still in the differential, her EKG does not show any ST segment elevations to suggest this and her pain is very localized to the right upper quadrant along the lower rib cartilage.  She has high inflammatory markers.    Assessment & Plan:  Right upper quadrant pain, differential diagnosis includes pericarditis.  She does have a small amount of pericardial effusion on her ultrasound and echocardiogram is pending but this finding is nonspecific and she does not have ST segment elevations that would make this diagnosis more convincing.  Given her family history and elevated d-dimer, we are awaiting CT angiography to exclude PE.  She is tachycardic but states that she always has some tachycardia at baseline.  When palpating in the right upper quadrant lower ribs she has reproduction of the exact pain that brought her into the emergency department.  This is more suggestive of costochondritis and she may improve simply with a few days of high-dose ibuprofen.  Finally we are awaiting PCR data to exclude Roxana Hires syndrome -Follow-up GC/Chlamydia -Continue ibuprofen Continue omeprazole-  -Discontinue colchicine -Follow-up echocardiogram -ESR and CRP are elevated -D-dimer: Elevated -CT  angios PE protocol  Sinus tachycardia, appears to be a chronic issue according to patient -TSH within normal limits -Workup for PE as above  Hypothyroidism, TSH within normal limits, continue Synthroid  Iron deficiency anemia, hemoglobin at her baseline of around 8 g/dL, continue iron supplementation  DVT prophylaxis: Lovenox Code Status: Full code Family Communication: None at bedside Disposition Plan: Potentially home later today if her workup is otherwise unremarkable.   Consultants:   Cardiology, Dr. Einar Gip  Procedures:  Echocardiogram pending  Antimicrobials:  Anti-infectives (From admission, onward)   None       Subjective: Patient states that she continues to have some right upper quadrant discomfort that is positional and that is worsened when she presses on the right upper quadrant or takes deep breath.  She has some associated shortness of breath.  She denies nausea.  Objective: Vitals:   01/03/18 0415 01/03/18 0430 01/03/18 0500 01/03/18 0648  BP: 139/87 134/71 129/86 (!) 132/91  Pulse: (!) 107 (!) 103 (!) 112 (!) 125  Resp: (!) 22 16 (!) 0 18  Temp:    98.2 F (36.8 C)  TempSrc:    Oral  SpO2: 100% 100% 100% 100%  Weight:    109.7 kg (241 lb 12.8 oz)  Height:    _0  (1.575 m)   No intake or output data in the 24 hours ending 01/03/18 1218 Filed Weights   01/02/18 2149 01/03/18 0648  Weight: 110.7 kg (244 lb) 109.7 kg (241 lb 12.8 oz)    Examination:  General exam:  Adult female.  No acute distress.  HEENT:  NCAT, MMM Respiratory system: Clear to auscultation bilaterally Cardiovascular  system: Mildly tachycardic, regular rhythm, normal S1/S2. No murmurs, rubs, gallops or clicks.  Warm extremities Gastrointestinal system: Normal active bowel sounds, soft, nondistended, nontender. MSK:  Normal tone and bulk, no lower extremity edema.  Point tenderness along the lower ribs on the right chest that reproduces the pain that brought her to the emergency  department. Neuro:  Grossly intact   Data Reviewed: I have personally reviewed following labs and imaging studies  CBC: Recent Labs  Lab 01/02/18 2232  WBC 9.3  NEUTROABS 6.1  HGB 8.9*  HCT 30.7*  MCV 63.8*  PLT 161*   Basic Metabolic Panel: Recent Labs  Lab 01/02/18 2232  NA 137  K 4.0  CL 103  CO2 27  GLUCOSE 107*  BUN 12  CREATININE 0.65  CALCIUM 9.0   GFR: Estimated Creatinine Clearance: 129.8 mL/min (by C-G formula based on SCr of 0.65 mg/dL). Liver Function Tests: Recent Labs  Lab 01/02/18 2232  AST 18  ALT 11*  ALKPHOS 98  BILITOT 0.7  PROT 7.6  ALBUMIN 3.4*   Recent Labs  Lab 01/02/18 2232  LIPASE 24   No results for input(s): AMMONIA in the last 168 hours. Coagulation Profile: No results for input(s): INR, PROTIME in the last 168 hours. Cardiac Enzymes: No results for input(s): CKTOTAL, CKMB, CKMBINDEX, TROPONINI in the last 168 hours. BNP (last 3 results) No results for input(s): PROBNP in the last 8760 hours. HbA1C: No results for input(s): HGBA1C in the last 72 hours. CBG: No results for input(s): GLUCAP in the last 168 hours. Lipid Profile: No results for input(s): CHOL, HDL, LDLCALC, TRIG, CHOLHDL, LDLDIRECT in the last 72 hours. Thyroid Function Tests: Recent Labs    01/03/18 0733  TSH 1.872   Anemia Panel: No results for input(s): VITAMINB12, FOLATE, FERRITIN, TIBC, IRON, RETICCTPCT in the last 72 hours. Urine analysis:    Component Value Date/Time   COLORURINE YELLOW 01/02/2018 2131   APPEARANCEUR HAZY (A) 01/02/2018 2131   LABSPEC 1.032 (H) 01/02/2018 2131   PHURINE 6.0 01/02/2018 2131   GLUCOSEU NEGATIVE 01/02/2018 2131   HGBUR NEGATIVE 01/02/2018 2131   [place] NEGATIVE 01/02/2018 2131   [place] NEGATIVE 01/02/2018 2131   PROTEINUR 30 (A) 01/02/2018 2131   NITRITE NEGATIVE 01/02/2018 2131   LEUKOCYTESUR NEGATIVE 01/02/2018 2131   Sepsis Labs: _0 (procalcitonin:4,lacticidven:4)  ) Recent Results  (from the past 240 hour(s))  Wet prep, genital     Status: Abnormal   Collection Time: 01/02/18 11:20 PM  Result Value Ref Range Status   Yeast Wet Prep HPF POC NONE SEEN NONE SEEN Final   Trich, Wet Prep NONE SEEN NONE SEEN Final   Clue Cells Wet Prep HPF POC NONE SEEN NONE SEEN Final   WBC, Wet Prep HPF POC MODERATE (A) NONE SEEN Final    Comment: FEW BACTERIA SEEN   Sperm NONE SEEN  Final    Comment: Performed at [facility], [address] [place], [place] [address]      Radiology Studies: Dg Chest 2 View  Result Date: 01/03/2018 CLINICAL DATA:  Subacute onset of generalized abdominal pain. Chest pain. EXAM: CHEST - 2 VIEW COMPARISON:  None. FINDINGS: The lungs are well-aerated and clear. There is no evidence of focal opacification, pleural effusion or pneumothorax. The heart is normal in size; the mediastinal contour is within normal limits. No acute osseous abnormalities are seen. IMPRESSION: No acute cardiopulmonary process seen. Electronically Signed   By: Garald Balding M.D.   On: 01/03/2018 03:10  [place] Abdomen Limited Ruq  Result Date: 01/02/2018 CLINICAL DATA:  Right upper quadrant pain EXAM: ULTRASOUND ABDOMEN LIMITED RIGHT UPPER QUADRANT COMPARISON:  None. FINDINGS: Gallbladder: No gallstones or wall thickening visualized. No sonographic Murphy sign noted by sonographer. Common bile duct: Diameter: 4 mm Liver: No focal lesion identified. Within normal limits in parenchymal echogenicity. Portal vein is patent on color Doppler imaging with normal direction of blood flow towards the liver. Incidentally noted is a small amount of pericardial effusion. IMPRESSION: 1. Negative for gallstones or biliary dilatation 2. Small amount of pericardial effusion Electronically Signed   By: Donavan Foil M.D.   On: 01/02/2018 23:56     Scheduled Meds: . enoxaparin (LOVENOX) injection  40 mg Subcutaneous Q24H  . ferrous sulfate  325 mg Oral BID WC  . ibuprofen  800 mg Oral TID  .  levothyroxine  25 mcg Oral QAC breakfast  . pantoprazole  40 mg Oral Daily   Continuous Infusions:   LOS: 0 days    Time spent: 30 min    Janece Canterbury, MD [facility] Pager [phone]  If 7PM-7AM, please contact night-coverage [email or web address] Password [email or web address] 01/03/2018, 12:18 PM

## 2018-01-03 NOTE — Progress Notes (Signed)
  Echocardiogram 2D Echocardiogram has been performed.  Leah Allen G Toy Eisemann 01/03/2018, 2:26 PM

## 2018-01-03 NOTE — ED Provider Notes (Signed)
[facility] EMERGENCY DEPARTMENT Provider Note   CSN: [id number] Arrival date & time: 01/02/18  2123     History   Chief Complaint Chief Complaint  Patient presents with  . Abdominal Pain    HPI Leah Allen is a 22 y.o. female.  22 yo F with a chief complaint of epigastric pain.  This been going on for the past 3 days off and on.  Usually worse when she lays back flat and better when she sits up.  She was woken up from sleep this morning with severe worsening of that pain.  She had had a fever earlier today of about 100.  She went to the [facility]U, where she was evaluated for right upper quadrant tenderness and incidentally found a pericardial effusion.  I discussed this case with the nurse practitioner there and had her transferred to the emergency department.  The patient denies any lower extremity edema denies worsening shortness of breath on exertion.  She has felt a couple times like she may pass out.  She denies viral prodrome.   The history is provided by the patient.  Abdominal Pain   This is a new problem. The current episode started 2 days ago. The problem occurs constantly. The problem has been rapidly worsening. The pain is located in the epigastric region. The quality of the pain is sharp and shooting. The pain is at a severity of 10/10. The pain is severe. Associated symptoms include fever. Pertinent negatives include nausea, vomiting, dysuria, headaches, arthralgias and myalgias. Nothing aggravates the symptoms. Nothing relieves the symptoms.    Past Medical History:  Diagnosis Date  . Asthma   . Hypothyroidism   . Miscarriage   . 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 Thyroid disease     Patient Active Problem List   Diagnosis Date Noted  . Pericarditis 01/03/2018  . Iron deficiency anemia 01/03/2018  . Hypothyroidism   . Acute abdominal pain in right upper quadrant   . Postpartum care following vaginal delivery  08/10/2017  . Rh negative status during pregnancy 08/10/2017    Past Surgical History:  Procedure Laterality Date  . NO PAST SURGERIES      OB History    Gravida Para Term Preterm AB Living   1         0   SAB TAB Ectopic Multiple Live Births                   Home Medications    Prior to Admission medications   Medication Sig Start Date End Date Taking? Authorizing Provider  levothyroxine (SYNTHROID, LEVOTHROID) 25 MCG tablet Take 25 mcg by mouth daily before breakfast.   Yes [provider]  cephALEXin (KEFLEX) 500 MG capsule Take 1 capsule (500 mg total) by mouth 4 (four) times daily. Patient not taking: Reported on 10/19/2017 08/15/17   Montez Morita, CNM  ferrous sulfate 325 (65 FE) MG tablet Take 1 tablet (325 mg total) by mouth daily. Patient not taking: Reported on 10/19/2017 09/01/16   Muthersbaugh, Dahlia Client, PA-C  ferrous sulfate 325 (65 FE) MG tablet Take 1 tablet (325 mg total) by mouth daily with breakfast. TAKE WITH ORANGE JUICE Patient not taking: Reported on 01/03/2018 10/19/17   Street, Jackson, PA-C  ondansetron (ZOFRAN ODT) 4 MG disintegrating tablet Take 1 tablet (4 mg total) by mouth every 8 (eight) hours as needed for nausea or vomiting. Patient not taking:  Reported on 01/03/2018 10/19/17   Street, Stewart, PA-C  oxyCODONE-acetaminophen (ROXICET) 5-325 MG tablet Take 1 tablet by mouth every 4 (four) hours as needed for severe pain. Patient not taking: Reported on 10/19/2017 08/15/17   Montez Morita, CNM  potassium chloride SA (K-DUR,KLOR-CON) 20 MEQ tablet Take 1 tablet (20 mEq total) by mouth daily. Patient not taking: Reported on 10/19/2017 09/01/16   Muthersbaugh, Dahlia Client, PA-C    Family History Family History  Problem Relation Age of Onset  . Diabetes Father   . Clotting disorder Mother     Social History Social History   Tobacco Use  . Smoking status: Never Smoker  . Smokeless tobacco: Never Used  Substance Use Topics  . Alcohol  use: Yes  . Drug use: Yes    Types: Marijuana     Allergies   Sulfa antibiotics   Review of Systems Review of Systems  Constitutional: Positive for fatigue and fever. Negative for chills.  HENT: Negative for congestion and rhinorrhea.   Eyes: Negative for redness and visual disturbance.  Respiratory: Positive for shortness of breath. Negative for wheezing.   Cardiovascular: Positive for chest pain. Negative for palpitations.  Gastrointestinal: Positive for abdominal pain. Negative for nausea and vomiting.  Genitourinary: Negative for dysuria and urgency.  Musculoskeletal: Negative for arthralgias and myalgias.  Skin: Negative for pallor and wound.  Neurological: Positive for syncope (near). Negative for dizziness and headaches.     Physical Exam Updated Vital Signs BP 129/86   Pulse (!) 112   Temp 98.7 F (37.1 C) (Oral)   Resp (!) 0   Ht 5\' 2"  (1.575 m)   Wt 110.7 kg (244 lb)   SpO2 100%   BMI 44.63 kg/m   Physical Exam  Constitutional: She is oriented to person, place, and time. She appears well-developed and well-nourished. No distress.  obese  HENT:  Head: Normocephalic and atraumatic.  Eyes: EOM are normal. Pupils are equal, round, and reactive to light.  Neck: Normal range of motion. Neck supple.  Cardiovascular: Normal rate and regular rhythm. Exam reveals no gallop and no friction rub.  No murmur heard. Pulmonary/Chest: Effort normal. She has no wheezes. She has no rales.  Abdominal: Soft. She exhibits no distension. There is no tenderness. There is negative Murphy's sign.  Musculoskeletal: She exhibits no edema or tenderness.  Neurological: She is alert and oriented to person, place, and time.  Skin: Skin is warm and dry. She is not diaphoretic.  Psychiatric: She has a normal mood and affect. Her behavior is normal.  Nursing note and vitals reviewed.    ED Treatments / Results  Labs (all labs ordered are listed, but only abnormal results are  displayed) Labs Reviewed  WET PREP, GENITAL - Abnormal; Notable for the following components:      Result Value   WBC, Wet Prep HPF POC MODERATE (*)    All other components within normal limits  URINALYSIS, ROUTINE W REFLEX MICROSCOPIC - Abnormal; Notable for the following components:   APPearance HAZY (*)    Specific Gravity, Urine 1.032 (*)    Protein, ur 30 (*)    Squamous Epithelial / LPF 0-5 (*)    All other components within normal limits  CBC WITH DIFFERENTIAL/PLATELET - Abnormal; Notable for the following components:   Hemoglobin 8.9 (*)    HCT 30.7 (*)    MCV 63.8 (*)    MCH 18.5 (*)    MCHC 29.0 (*)    RDW 20.5 (*)  Platelets 403 (*)    All other components within normal limits  COMPREHENSIVE METABOLIC PANEL - Abnormal; Notable for the following components:   Glucose, Bld 107 (*)    Albumin 3.4 (*)    ALT 11 (*)    All other components within normal limits  LIPASE, BLOOD  C-REACTIVE PROTEIN  SEDIMENTATION RATE  HIV ANTIBODY (ROUTINE TESTING)  POCT PREGNANCY, URINE  I-STAT TROPONIN, ED  GC/CHLAMYDIA PROBE AMP ([place]    EKG  EKG Interpretation  Date/Time:  Sunday January 03 2018 03:34:18 EDT Ventricular Rate:  95 PR Interval:    QRS Duration: 85 QT Interval:  350 QTC Calculation: 440 R Axis:   34 Text Interpretation:  Sinus rhythm No significant change since last tracing Confirmed by Yun Gutierrez ([id number]) on 01/03/2018 4:03:41 AM       Radiology Dg Chest 2 View  Result Date: 01/03/2018 CLINICAL DATA:  Subacute onset of generalized abdominal pain. Chest pain. EXAM: CHEST - 2 VIEW COMPARISON:  None. FINDINGS: The lungs are well-aerated and clear. There is no evidence of focal opacification, pleural effusion or pneumothorax. The heart is normal in size; the mediastinal contour is within normal limits. No acute osseous abnormalities are seen. IMPRESSION: No acute cardiopulmonary process seen. Electronically Signed   By: Jeffery  Chang M.D.   On:  01/03/2018 03:10   Us Abdomen Limited Ruq  Result Date: 01/02/2018 CLINICAL DATA:  Right upper quadrant pain EXAM: ULTRASOUND ABDOMEN LIMITED RIGHT UPPER QUADRANT COMPARISON:  None. FINDINGS: Gallbladder: No gallstones or wall thickening visualized. No sonographic Murphy sign noted by sonographer. Common bile duct: Diameter: 4 mm Liver: No focal lesion identified. Within normal limits in parenchymal echogenicity. Portal vein is patent on color Doppler imaging with normal direction of blood flow towards the liver. Incidentally noted is a small amount of pericardial effusion. IMPRESSION: 1. Negative for gallstones or biliary dilatation 2. Small amount of pericardial effusion Electronically Signed   By: Kim  Fujinaga M.D.   On: 01/02/2018 23:56    Procedures Procedures (including critical care time)  Medications Ordered in ED Medications  colchicine tablet 0.6 mg (not administered)  ibuprofen (ADVIL,MOTRIN) tablet 800 mg (not administered)  ferrous sulfate tablet 325 mg (not administered)  oxyCODONE-acetaminophen (PERCOCET/ROXICET) 5-325 MG per tablet 1 tablet (not administered)  levothyroxine (SYNTHROID, LEVOTHROID) tablet 25 mcg (not administered)  enoxaparin (LOVENOX) injection 40 mg (not administered)  acetaminophen (TYLENOL) tablet 650 mg (not administered)    Or  acetaminophen (TYLENOL) suppository 650 mg (not administered)  ondansetron (ZOFRAN) tablet 4 mg (not administered)    Or  ondansetron (ZOFRAN) injection 4 mg (not administered)  zolpidem (AMBIEN) tablet 5 mg (not administered)  pantoprazole (PROTONIX) EC tablet 40 mg (not administered)  ketorolac (TORADOL) injection 60 mg (60 mg Intramuscular Given 01/02/18 2233)  gi cocktail (Maalox,Lidocaine,Donnatal) (30 mLs Oral Given 01/02/18 2233)     Initial Impression / Assessment and Plan / ED Course  I have reviewed the triage vital signs and the nursing notes.  Pertinent labs & imaging results that were available during my care of  the patient were reviewed by me and considered in my medical decision making (see chart for details).     21  yo F with a chief complaint of epigastric abdominal pain.  Based on the history and the workup done so far this is likely a pericarditis, as the patient has been febrile, tachycardic and has a pleural effusion on ultrasound I feel the patient likely needs to come  to the hospital and have a formal echo.  Will obtain a troponin EKG chest x-ray discussed with cardiology.  I discussed the case with Dr. Jacinto Halim, cardiology he recommended that orthostatic vital signs be performed and if positive the patient should be admitted to the hospitalist for an inpatient echocardiogram.  The patient upon standing heart rate went into the 140s.  Will discuss with the hospitalist for admission.  The patients results and plan were reviewed and discussed.   Any x-rays performed were independently reviewed by myself.   Differential diagnosis were considered with the presenting HPI.  Medications  colchicine tablet 0.6 mg (not administered)  ibuprofen (ADVIL,MOTRIN) tablet 800 mg (not administered)  ferrous sulfate tablet 325 mg (not administered)  oxyCODONE-acetaminophen (PERCOCET/ROXICET) 5-325 MG per tablet 1 tablet (not administered)  levothyroxine (SYNTHROID, LEVOTHROID) tablet 25 mcg (not administered)  enoxaparin (LOVENOX) injection 40 mg (not administered)  acetaminophen (TYLENOL) tablet 650 mg (not administered)    Or  acetaminophen (TYLENOL) suppository 650 mg (not administered)  ondansetron (ZOFRAN) tablet 4 mg (not administered)    Or  ondansetron (ZOFRAN) injection 4 mg (not administered)  zolpidem (AMBIEN) tablet 5 mg (not administered)  pantoprazole (PROTONIX) EC tablet 40 mg (not administered)  ketorolac (TORADOL) injection 60 mg (60 mg Intramuscular Given 01/02/18 2233)  gi cocktail (Maalox,Lidocaine,Donnatal) (30 mLs Oral Given 01/02/18 2233)    Vitals:   01/03/18 0400 01/03/18 0415  01/03/18 0430 01/03/18 0500  BP: 128/76 139/87 134/71 129/86  Pulse: 99 (!) 107 (!) 103 (!) 112  Resp: 14 (!) 22 16 (!) 0  Temp:      TempSrc:      SpO2: 100% 100% 100% 100%  Weight:      Height:        Final diagnoses:  Pericardial effusion  Acute idiopathic pericarditis    Admission/ observation were discussed with the admitting physician, patient and/or family and they are comfortable with the plan.    Final Clinical Impressions(s) / ED Diagnoses   Final diagnoses:  Pericardial effusion  Acute idiopathic pericarditis    ED Discharge Orders    None       Melene Plan, DO 01/03/18 [phone]

## 2018-01-04 ENCOUNTER — Other Ambulatory Visit: Payer: Self-pay | Admitting: Obstetrics and Gynecology

## 2018-01-04 LAB — GC/CHLAMYDIA PROBE AMP (~~LOC~~) NOT AT ARMC
CHLAMYDIA, DNA PROBE: POSITIVE — AB
NEISSERIA GONORRHEA: NEGATIVE

## 2018-01-04 MED ORDER — AZITHROMYCIN 500 MG PO TABS: 1000.0000 mg | ORAL_TABLET | Freq: Once | ORAL | 0 refills | Status: AC

## 2018-01-04 NOTE — Progress Notes (Signed)
Good morning, Your chlamydia swab is positive. I have sent a prescription to your pharmacy for treatment. Your partner will also need testing and treatment; he can get this done for free at the health department.

## 2018-01-04 NOTE — Progress Notes (Signed)
Rx for Azithromycin sent to pharmacy. My chart message sent.

## 2018-05-05 ENCOUNTER — Ambulatory Visit: Payer: Medicaid Other

## 2018-07-05 ENCOUNTER — Encounter (HOSPITAL_COMMUNITY): Payer: Self-pay | Admitting: Emergency Medicine

## 2018-07-05 ENCOUNTER — Emergency Department (HOSPITAL_COMMUNITY)
Admission: EM | Admit: 2018-07-05 | Discharge: 2018-07-05 | Disposition: A | Discharge status: Home / Self Care | Payer: Medicaid Other | Attending: Emergency Medicine | Admitting: Emergency Medicine

## 2018-07-05 ENCOUNTER — Emergency Department (HOSPITAL_COMMUNITY)
Admission: EM | Admit: 2018-07-05 | Discharge: 2018-07-05 | Disposition: A | Discharge status: Home / Self Care | Payer: Medicaid Other | Source: Home / Self Care | Attending: Emergency Medicine | Admitting: Emergency Medicine

## 2018-07-05 ENCOUNTER — Other Ambulatory Visit: Payer: Self-pay

## 2018-07-05 DIAGNOSIS — R200 Anesthesia of skin: Secondary | ICD-10-CM | POA: Insufficient documentation

## 2018-07-05 DIAGNOSIS — G510 Bell's palsy: Secondary | ICD-10-CM | POA: Insufficient documentation

## 2018-07-05 DIAGNOSIS — R220 Localized swelling, mass and lump, head: Secondary | ICD-10-CM | POA: Insufficient documentation

## 2018-07-05 DIAGNOSIS — Z5321 Procedure and treatment not carried out due to patient leaving prior to being seen by health care provider: Secondary | ICD-10-CM | POA: Insufficient documentation

## 2018-07-05 DIAGNOSIS — E039 Hypothyroidism, unspecified: Secondary | ICD-10-CM

## 2018-07-05 MED ORDER — VALACYCLOVIR HCL 1 G PO TABS: 1000.0000 mg | ORAL_TABLET | Freq: Three times a day (TID) | ORAL | 0 refills | Status: AC

## 2018-07-05 MED ORDER — PREDNISONE 20 MG PO TABS: ORAL_TABLET | ORAL | 0 refills | Status: DC

## 2018-07-05 NOTE — ED Triage Notes (Addendum)
Patient c/o facial numbness, tingling, and facial droop that started 1 week ago. Patient went to doctor who told pt to take benadryl with no relief. Patient states left and right side of face will intermittently start becoming numb. Patient states tongue will swell then go down. Patient was seen at [facility] and dx with bells palsey. Patient was told she would be fast tracked at hospital and left due to wait time. Denies SOB

## 2018-07-05 NOTE — ED Provider Notes (Cosign Needed)
MSE was initiated and I personally evaluated the patient and placed orders (if any) at  2:06 PM on July 05, 2018.  The patient appears stable so that the remainder of the MSE may be completed by another provider.  Patient placed in Quick Look pathway, seen and evaluated   Chief Complaint: tongue swelling  HPI:   R sided tongue swelling and facial spasms since Wednesday.  Ate potato salad thinks that may have triggered this.  She seen by her CP who told her to take Benadryl.  No difficulty swallowing or changes in voice.  ROS: Tongue swelling (one)  Physical Exam:   Gen: No distress  Neuro: Awake and Alert  Skin: Warm    Focused Exam: Patient with obvious right-sided facial paralysis.  Unable to raise the eye right eyebrow.  Unable to keep the eye closed.  Exams appears consistent with Bell's palsy.   Initiation of care has begun. The patient has been counseled on the process, plan, and necessity for staying for the completion/evaluation, and the remainder of the medical screening examination    Arthor Captain, PA-C 07/05/18 1413

## 2018-07-05 NOTE — Discharge Instructions (Addendum)
You were evaluated in the Emergency Department and after careful evaluation, we did not find any emergent condition requiring admission or further testing in the hospital.  Your symptoms today seem to be due to Bell's palsy.  Please take the medications provided as directed and consider eye protection at night if you're having more trouble closing her right eye.  Please return to the Emergency Department if you experience any worsening of your condition.  We encourage you to follow up with a primary care provider.  Thank you for allowing [place] to be a part of your care.

## 2018-07-05 NOTE — ED Triage Notes (Addendum)
Pt reports she began having "facial spasm" and feeling like R side of tongue was swollen on Wednesday, saw her doctor who told her to take benadryl for possible allergic reaction.  States that she is unable to move the right side of her face. Pt denies sob or difficulty swallowing but states that she feels drained, speech clear, resp e/u, nad.

## 2018-07-05 NOTE — ED Notes (Signed)
Pt's name called for vitals no answer 

## 2018-07-05 NOTE — ED Notes (Signed)
Pt did not respond when called in waiting room, will call again in a few minutes

## 2018-07-05 NOTE — ED Provider Notes (Signed)
[facility] Emergency Department Provider Note MRN:  [id number]  Arrival date & time: 07/05/18     Chief Complaint   Facial weakness History of Present Illness   Leah Allen is a 22 y.o. year-old female with a history of epilepsy presenting to the ED with chief complaint of facial weakness.  About 1 week ago, patient began experiencing a tingling/heavy sensation to the right side of her face.  Decreased sensation to the right side of her tongue.  This became much worse 3 days ago.  She is seek medical care twice this week, initially thought she had a allergic reaction, was at [facility] earlier today but left due to wait times during July.  She denies recent headache or vision change, no fevers, no tick bites, no chest pain or shortness of breath, no abdominal pain, no numbness or weakness to the arms or legs.  Review of Systems  A complete 10 system review of systems was obtained and all systems are negative except as noted in the HPI and PMH.   Patient's Health History    Past Medical History:  Diagnosis Date  . Asthma   . Hypothyroidism   . Miscarriage   . 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 Thyroid disease     Past Surgical History:  Procedure Laterality Date  . NO PAST SURGERIES      Family History  Problem Relation Age of Onset  . Diabetes Father   . Clotting disorder Mother     Social History   Socioeconomic History  . Marital status: Single    Spouse name: Not on file  . Number of children: Not on file  . Years of education: Not on file  . Highest education level: Not on file  Occupational History  . Not on file  Social Needs  . Financial resource strain: Not on file  . Food insecurity:    Worry: Not on file    Inability: Not on file  . Transportation needs:    Medical: Not on file    Non-medical: Not on file  Tobacco Use  . Smoking status: Never Smoker  . Smokeless tobacco: Never Used  Substance and  Sexual Activity  . Alcohol use: Yes  . Drug use: Yes    Types: Marijuana  . Sexual activity: Yes  Lifestyle  . Physical activity:    Days per week: Not on file    Minutes per session: Not on file  . Stress: Not on file  Relationships  . Social connections:    Talks on phone: Not on file    Gets together: Not on file    Attends religious service: Not on file    Active member of club or organization: Not on file    Attends meetings of clubs or organizations: Not on file    Relationship status: Not on file  . Intimate partner violence:    Fear of current or ex partner: Not on file    Emotionally abused: Not on file    Physically abused: Not on file    Forced sexual activity: Not on file  Other Topics Concern  . Not on file  Social History Narrative  . Not on file     Physical Exam  Vital Signs and Nursing Notes reviewed Vitals:   07/05/18 1850  BP: (!) 130/91  Pulse: (!) 103  Resp: 16  Temp: 98.1 F (36.7 C)  SpO2:  100%    CONSTITUTIONAL: Well-appearing, NAD NEURO:  Alert and oriented x 3, right-sided facial weakness including the forehead, brow and periorbital region. EYES:  eyes equal and reactive ENT/NECK:  no LAD, no JVD CARDIO: Regular rate, well-perfused, normal S1 and S2 PULM:  CTAB no wheezing or rhonchi GI/GU:  normal bowel sounds, non-distended, non-tender MSK/SPINE:  No gross deformities, no edema SKIN:  no rash, atraumatic PSYCH:  Appropriate speech and behavior  Diagnostic and Interventional Summary    EKG Interpretation  Date/Time:    Ventricular Rate:    PR Interval:    QRS Duration:   QT Interval:    QTC Calculation:   R Axis:     Text Interpretation:        Labs Reviewed - No data to display  No orders to display    Medications - No data to display   Procedures Critical Care  ED Course and Medical Decision Making  I have reviewed the triage vital signs and the nursing notes.  Pertinent labs & imaging results that were  available during my care of the patient were reviewed by me and considered in my medical decision making (see below for details).    History and physical consistent with Bell's palsy in this 22 year old female otherwise fairly healthy.  No recent tick bites to suggest Lyme.  We will treat with steroids and antivirals, patient informed of the need for eye protection, informed of the potential long recovery or in rare circumstances lack of recovery.  After the discussed management above, the patient was determined to be safe for discharge.  The patient was in agreement with this plan and all questions regarding their care were answered.  ED return precautions were discussed and the patient will return to the ED with any significant worsening of condition.  Elmer Sow. Pilar Plate, MD [facility] Emergency Medicine [facility] [email or web address]  Final Clinical Impressions(s) / ED Diagnoses     ICD-10-CM   1. Bell's palsy G51.0     ED Discharge Orders         Ordered    predniSONE (DELTASONE) 20 MG tablet     07/05/18 2142    valACYclovir (VALTREX) 1000 MG tablet  3 times daily     07/05/18 2142             Sabas Sous, MD 07/05/18 2143

## 2018-07-05 NOTE — ED Notes (Signed)
ED Provider at bedside. 

## 2018-07-05 NOTE — ED Notes (Signed)
PA Tiburcio Pea will place appropriate orders after assessing

## 2018-09-15 ENCOUNTER — Other Ambulatory Visit (HOSPITAL_COMMUNITY)
Admission: RE | Admit: 2018-09-15 | Discharge: 2018-09-15 | Disposition: A | Discharge status: Home / Self Care | Payer: Medicaid Other | Source: Ambulatory Visit | Attending: Obstetrics and Gynecology | Admitting: Obstetrics and Gynecology

## 2018-09-15 ENCOUNTER — Other Ambulatory Visit: Payer: Self-pay | Admitting: Obstetrics and Gynecology

## 2018-09-15 DIAGNOSIS — Z01419 Encounter for gynecological examination (general) (routine) without abnormal findings: Secondary | ICD-10-CM | POA: Insufficient documentation

## 2018-09-21 LAB — CYTOLOGY - PAP: Diagnosis: NEGATIVE

## 2021-01-11 ENCOUNTER — Emergency Department (HOSPITAL_COMMUNITY): Payer: BC Managed Care – PPO

## 2021-01-11 ENCOUNTER — Emergency Department (HOSPITAL_COMMUNITY)
Admission: EM | Admit: 2021-01-11 | Discharge: 2021-01-11 | Disposition: A | Discharge status: Home / Self Care | Payer: BC Managed Care – PPO | Attending: Emergency Medicine | Admitting: Emergency Medicine

## 2021-01-11 ENCOUNTER — Encounter (HOSPITAL_COMMUNITY): Payer: Self-pay

## 2021-01-11 ENCOUNTER — Other Ambulatory Visit: Payer: Self-pay

## 2021-01-11 DIAGNOSIS — E039 Hypothyroidism, unspecified: Secondary | ICD-10-CM | POA: Diagnosis not present

## 2021-01-11 DIAGNOSIS — R1031 Right lower quadrant pain: Secondary | ICD-10-CM | POA: Diagnosis present

## 2021-01-11 DIAGNOSIS — J45909 Unspecified asthma, uncomplicated: Secondary | ICD-10-CM | POA: Insufficient documentation

## 2021-01-11 DIAGNOSIS — K769 Liver disease, unspecified: Secondary | ICD-10-CM | POA: Insufficient documentation

## 2021-01-11 DIAGNOSIS — N23 Unspecified renal colic: Secondary | ICD-10-CM | POA: Insufficient documentation

## 2021-01-11 LAB — COMPREHENSIVE METABOLIC PANEL
ALT: 14 U/L (ref 0–44)
AST: 18 U/L (ref 15–41)
Albumin: 3.5 g/dL (ref 3.5–5.0)
Alkaline Phosphatase: 57 U/L (ref 38–126)
Anion gap: 11 (ref 5–15)
BUN: 8 mg/dL (ref 6–20)
CO2: 21 mmol/L — ABNORMAL LOW (ref 22–32)
Calcium: 8.9 mg/dL (ref 8.9–10.3)
Chloride: 107 mmol/L (ref 98–111)
Creatinine, Ser: 0.8 mg/dL (ref 0.44–1.00)
GFR, Estimated: >60 mL/min (ref >60)
Glucose, Bld: 120 mg/dL — ABNORMAL HIGH (ref 70–99)
Potassium: 3.1 mmol/L — ABNORMAL LOW (ref 3.5–5.1)
Sodium: 139 mmol/L (ref 135–145)
Total Bilirubin: 1 mg/dL (ref 0.3–1.2)
Total Protein: 6.4 g/dL — ABNORMAL LOW (ref 6.5–8.1)

## 2021-01-11 LAB — URINALYSIS, ROUTINE W REFLEX MICROSCOPIC
Glucose, UA: NEGATIVE mg/dL
Ketones, ur: >80 mg/dL — AB
Nitrite: NEGATIVE
Protein, ur: NEGATIVE mg/dL
Specific Gravity, Urine: 1.015 (ref 1.005–1.030)
pH: 6 (ref 5.0–8.0)

## 2021-01-11 LAB — LIPASE, BLOOD: Lipase: 34 U/L (ref 11–51)

## 2021-01-11 LAB — CBC
HCT: 35.8 % — ABNORMAL LOW (ref 36.0–46.0)
Hemoglobin: 10.6 g/dL — ABNORMAL LOW (ref 12.0–15.0)
MCH: 21.5 pg — ABNORMAL LOW (ref 26.0–34.0)
MCHC: 29.6 g/dL — ABNORMAL LOW (ref 30.0–36.0)
MCV: 72.6 fL — ABNORMAL LOW (ref 80.0–100.0)
Platelets: 396 10*3/uL (ref 150–400)
RBC: 4.93 MIL/uL (ref 3.87–5.11)
RDW: 19.9 % — ABNORMAL HIGH (ref 11.5–15.5)
WBC: 7 10*3/uL (ref 4.0–10.5)
nRBC: 0 % (ref 0.0–0.2)

## 2021-01-11 LAB — URINALYSIS, MICROSCOPIC (REFLEX)

## 2021-01-11 LAB — I-STAT BETA HCG BLOOD, ED (MC, WL, AP ONLY): I-stat hCG, quantitative: <5 m[IU]/mL (ref <5)

## 2021-01-11 NOTE — ED Triage Notes (Signed)
Brought in by [facility] from home, Abdominal pain approx ago RLQ area with nausea, vomiting and diarrhea. g20 left ac. 4mg  zofran IV.

## 2021-01-11 NOTE — ED Notes (Signed)
Patient transported to CT 

## 2021-01-11 NOTE — Discharge Instructions (Addendum)
The CT scan did not show a kidney stone but it did suggest that you may have recently passed 1.  Your lab test did show blood in the urine suggesting a recently passed kidney stone.  CAT scan did show an incidental finding in the liver area.  The radiologist recommended an outpatient ultrasound to evaluate that further.  Please contact your primary care doctor to schedule that outpatient test.

## 2021-01-11 NOTE — ED Provider Notes (Signed)
[facility] EMERGENCY DEPARTMENT Provider Note   CSN: [id number] Arrival date & time: 01/11/21  3151     History Chief Complaint  Patient presents with  . Abdominal Pain    Leah Allen is a 25 y.o. female.  HPI   Patient woke up this morning with intense pain in the right lower quadrant.  Patient states she went to bed feeling fine.  Suddenly she woke up early this morning with severe pain in her right lower abdomen.  She did have episode of nausea and vomiting.  Patient felt like she was going to pass out from the pain.  She had another episode after arriving in the ED while waiting in the waiting room.  Patient states the symptoms have improved at this time has not completely resolved.  She is not having any fevers.  No trouble with dysuria or urgency.  No vaginal bleeding or discharge.  Patient's never had symptoms like this in the past.  Past Medical History:  Diagnosis Date  . Asthma   . Hypothyroidism   . Miscarriage   . 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 Thyroid disease     Patient Active Problem List   Diagnosis Date Noted  . Pericarditis 01/03/2018  . Iron deficiency anemia 01/03/2018  . Costochondritis, acute 01/03/2018  . Hypothyroidism   . Acute abdominal pain in right upper quadrant   . Postpartum care following vaginal delivery 08/10/2017  . Rh negative status during pregnancy 08/10/2017    Past Surgical History:  Procedure Laterality Date  . NO PAST SURGERIES       OB History    Gravida  1   Para      Term      Preterm      AB      Living  0     SAB      IAB      Ectopic      Multiple      Live Births              Family History  Problem Relation Age of Onset  . Diabetes Father   . Clotting disorder Mother     Social History   Tobacco Use  . Smoking status: Never Smoker  . Smokeless tobacco: Never Used  Vaping Use  . Vaping Use: Never used  Substance Use Topics   . Alcohol use: Yes  . Drug use: Yes    Types: Marijuana    Home Medications Prior to Admission medications   Medication Sig Start Date End Date Taking? Authorizing Provider  levothyroxine (SYNTHROID, LEVOTHROID) 25 MCG tablet Take 25 mcg by mouth as needed (thyroid).   Yes [provider]  ferrous sulfate 325 (65 FE) MG tablet Take 1 tablet (325 mg total) by mouth 2 (two) times daily with a meal. Patient not taking: Reported on 01/11/2021 01/04/18   Renae Fickle, MD  ibuprofen (ADVIL,MOTRIN) 800 MG tablet Take 1 tablet (800 mg total) by mouth 3 (three) times daily. Patient not taking: Reported on 01/11/2021 01/03/18   Renae Fickle, MD  omeprazole (PRILOSEC) 20 MG capsule Take 1 capsule (20 mg total) by mouth daily. Patient not taking: Reported on 01/11/2021 01/03/18 02/02/18  Renae Fickle, MD    Allergies    Sulfa antibiotics  Review of Systems   Review of Systems  All other systems reviewed and are negative.   Physical Exam Updated Vital  Signs BP 115/71   Pulse 69   Temp 98.2 F (36.8 C) (Oral)   Resp 14   Ht 1.575 m (5\' 2" )   Wt 86.2 kg   SpO2 100%   BMI 34.75 kg/m   Physical Exam Vitals and nursing note reviewed.  Constitutional:      General: She is not in acute distress.    Appearance: She is well-developed.  HENT:     Head: Normocephalic and atraumatic.     Right Ear: External ear normal.     Left Ear: External ear normal.  Eyes:     General: No scleral icterus.       Right eye: No discharge.        Left eye: No discharge.     Conjunctiva/sclera: Conjunctivae normal.  Neck:     Trachea: No tracheal deviation.  Cardiovascular:     Rate and Rhythm: Normal rate and regular rhythm.  Pulmonary:     Effort: Pulmonary effort is normal. No respiratory distress.     Breath sounds: Normal breath sounds. No stridor. No wheezing or rales.  Abdominal:     General: Bowel sounds are normal. There is no distension.     Palpations: Abdomen is soft.      Tenderness: There is no abdominal tenderness. There is no guarding or rebound.  Musculoskeletal:        General: No tenderness.     Cervical back: Neck supple.  Skin:    General: Skin is warm and dry.     Findings: No rash.  Neurological:     Mental Status: She is alert.     Cranial Nerves: No cranial nerve deficit (no facial droop, extraocular movements intact, no slurred speech).     Sensory: No sensory deficit.     Motor: No abnormal muscle tone or seizure activity.     Coordination: Coordination normal.     ED Results / Procedures / Treatments   Labs (all labs ordered are listed, but only abnormal results are displayed) Labs Reviewed  COMPREHENSIVE METABOLIC PANEL - Abnormal; Notable for the following components:      Result Value   Potassium 3.1 (*)    CO2 21 (*)    Glucose, Bld 120 (*)    Total Protein 6.4 (*)    All other components within normal limits  CBC - Abnormal; Notable for the following components:   Hemoglobin 10.6 (*)    HCT 35.8 (*)    MCV 72.6 (*)    MCH 21.5 (*)    MCHC 29.6 (*)    RDW 19.9 (*)    All other components within normal limits  URINALYSIS, ROUTINE W REFLEX MICROSCOPIC - Abnormal; Notable for the following components:   Hgb urine dipstick LARGE (*)    Bilirubin Urine SMALL (*)    Ketones, ur >80 (*)    Leukocytes,Ua TRACE (*)    All other components within normal limits  URINALYSIS, MICROSCOPIC (REFLEX) - Abnormal; Notable for the following components:   Bacteria, UA FEW (*)    All other components within normal limits  LIPASE, BLOOD  I-STAT BETA HCG BLOOD, ED ([facility], [facility], [facility] ONLY)    EKG None  Radiology CT Renal Stone Study  Result Date: 01/11/2021 CLINICAL DATA:  Right flank pain EXAM: CT ABDOMEN AND PELVIS WITHOUT CONTRAST TECHNIQUE: Multidetector CT imaging of the abdomen and pelvis was performed following the standard protocol without IV contrast. COMPARISON:  None. FINDINGS: Lower chest: No acute abnormality. Hepatobiliary:  There is an approximately 2.7 x 2.3 cm hypoattenuating lesion the right hepatic lobe. Additional smaller hypoattenuating lesion along the fissure for the ligamentum teres. Gallbladder is unremarkable. No biliary dilatation. Pancreas: Unremarkable. Spleen: Unremarkable. Adrenals/Urinary Tract: Adrenals are unremarkable. There are no renal calculi. No hydronephrosis. Mild right perinephric stranding. No ureteral calculus. Partially distended bladder is unremarkable. Stomach/Bowel: Stomach is within normal limits. Bowel is normal in caliber. Appendix is normal. Vascular/Lymphatic: No significant vascular abnormality. No enlarged lymph nodes identified Reproductive: Uterus and bilateral adnexa are unremarkable. Other: No free fluid.  Abdominal wall is unremarkable Musculoskeletal: No significant or acute osseous abnormality. IMPRESSION: Mild right perinephric stranding. May reflect pyelonephritis, which is not well evaluated on this study or recently passed calculus. Indeterminate 2.7 cm right hepatic lobe lesion. Additional smaller lesion along the fissure for the ligamentum teres could reflect focal fat. Ultrasound recommended for further evaluation. Electronically Signed   By: Guadlupe Spanish M.D.   On: 01/11/2021 10:03    Procedures Procedures   Medications Ordered in ED Medications - No data to display  ED Course  I have reviewed the triage vital signs and the nursing notes.  Pertinent labs & imaging results that were available during my care of the patient were reviewed by me and considered in my medical decision making (see chart for details).  Clinical Course as of 01/11/21 1328  Fri Jan 11, 2021  1252 Urinalysis shows large hemoglobin. [JK]    Clinical Course User Index [JK] Linwood Dibbles, MD   MDM Rules/Calculators/A&P                         Patient presented to the ED for evaluation of abdominal pain.  Symptoms were intense initially but resolved during her ED stay.  On onset of symptoms  argues against appendicitis.  Patient was not having any complaints of vaginal discharge or bleeding to suggest ovarian etiology.  Doubt torsion.  Patient did have blood in her urine and the CT scan did show evidence of some perinephric stranding on the right side.  No findings suggest pyelonephritis and I suspect she probably recently passed a kidney stone.  Discussed these findings with the patient and her father.  Incidental liver finding noted.  Discussed this with the patient and recommend outpatient ultrasound. Final Clinical Impression(s) / ED Diagnoses Final diagnoses:  Ureteral colic  Liver lesion    Rx / DC Orders ED Discharge Orders    None       Linwood Dibbles, MD 01/11/21 1328

## 2021-01-11 NOTE — ED Notes (Signed)
2 unsuccessful attempts at obtaining IV access; another RN to attempt. Providers made aware.

## 2022-06-19 IMAGING — CT CT RENAL STONE PROTOCOL
2 of 4 series · 17 of 46 positions shown, 19 images · non-contrast
Comparison: None.

CLINICAL DATA: Right flank pain

EXAM:
CT ABDOMEN AND PELVIS WITHOUT CONTRAST
TECHNIQUE: Multidetector CT imaging of the abdomen and pelvis was performed
following the standard protocol without IV contrast.

[Series 3: renal stone 5.0 · axial · 0.77mm/px · z∈[+797,+1197]mm · 14 of 88 slices shown, 16 images]
[im 4/88  soft-tissue]
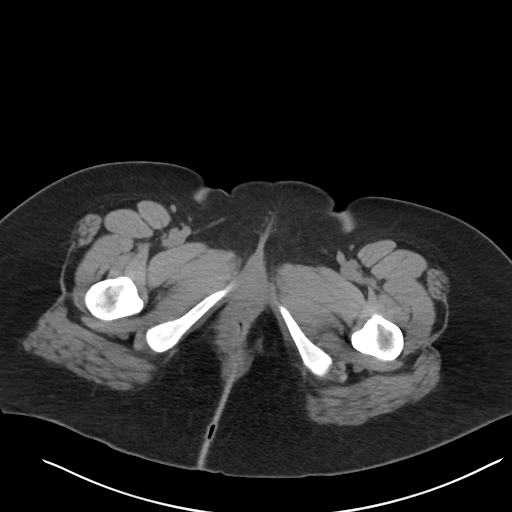
[im 4/88  bone]
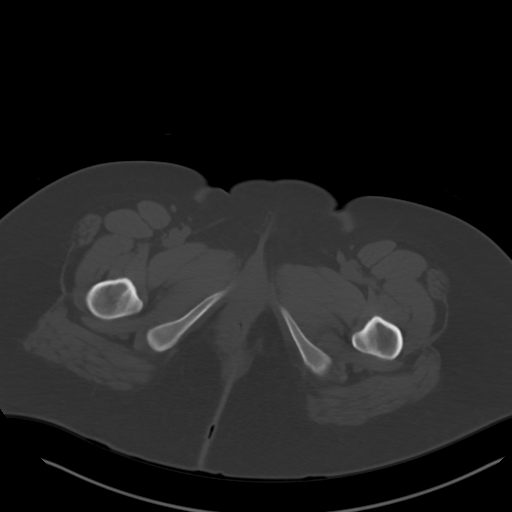
[im 12/88  soft-tissue]
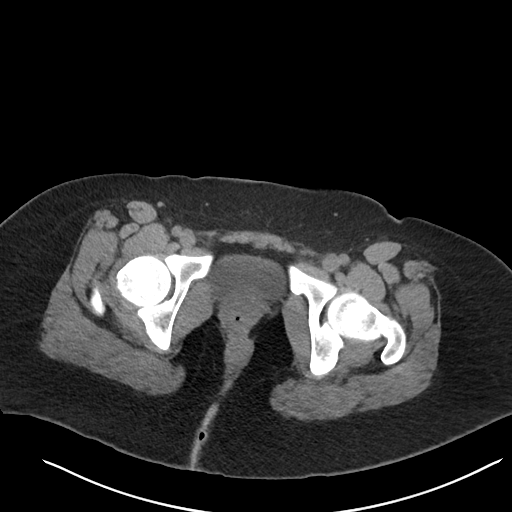
[im 16/88  soft-tissue]
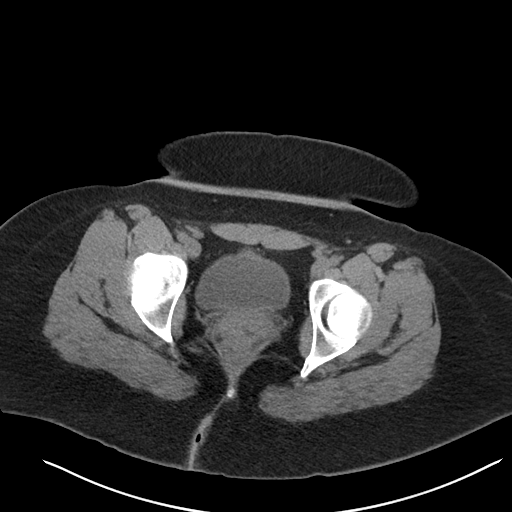
[im 23/88  soft-tissue]
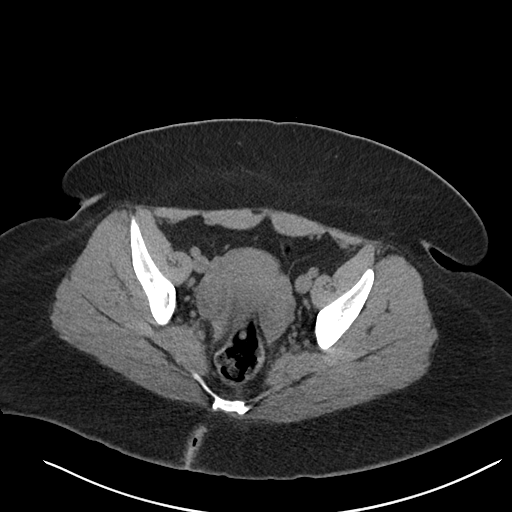
[im 31/88  soft-tissue]
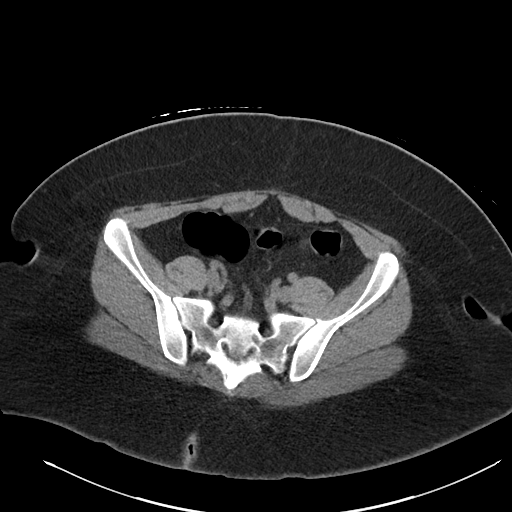
[im 35/88  soft-tissue]
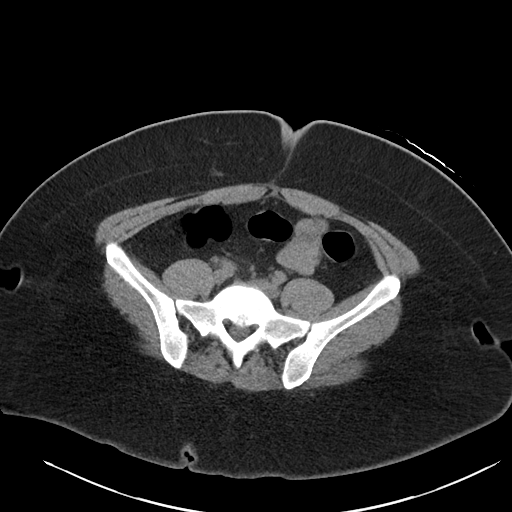
[im 42/88  soft-tissue]
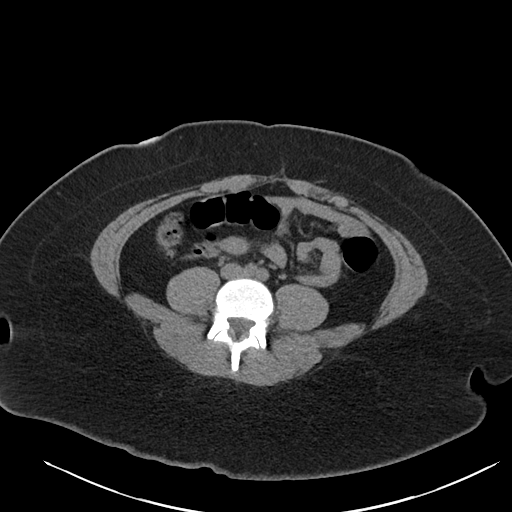
[im 46/88  soft-tissue]
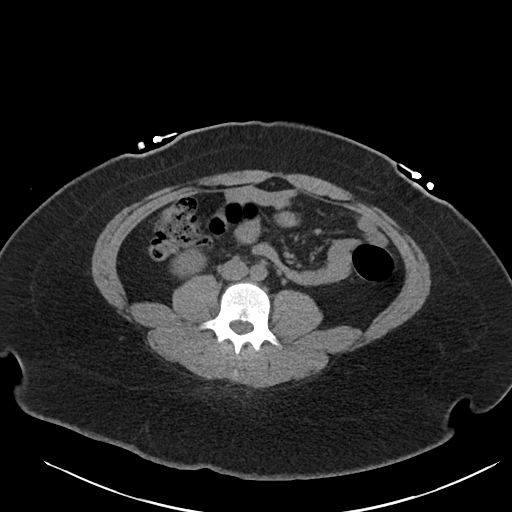
[im 53/88  soft-tissue]
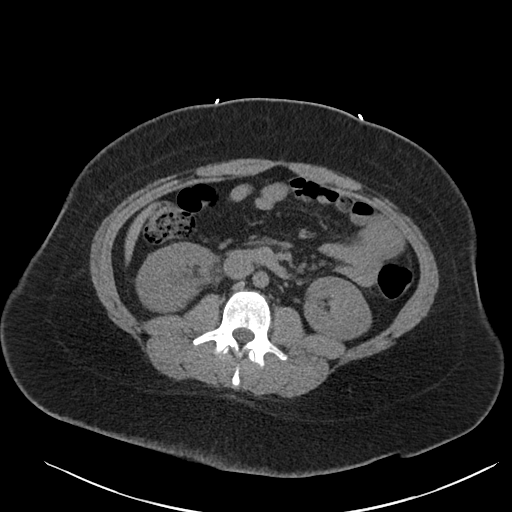
[im 53/88  bone]
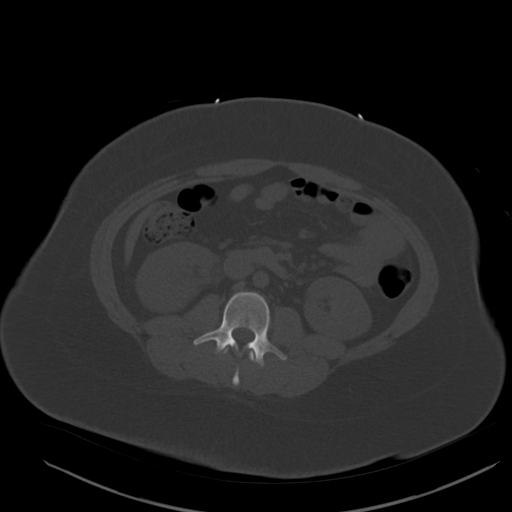
[im 57/88  soft-tissue]
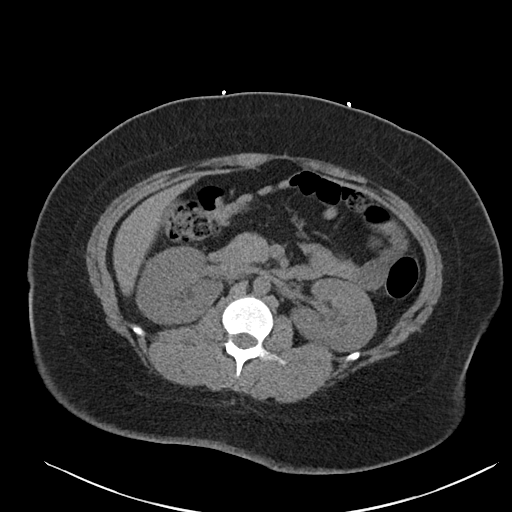
[im 65/88  soft-tissue]
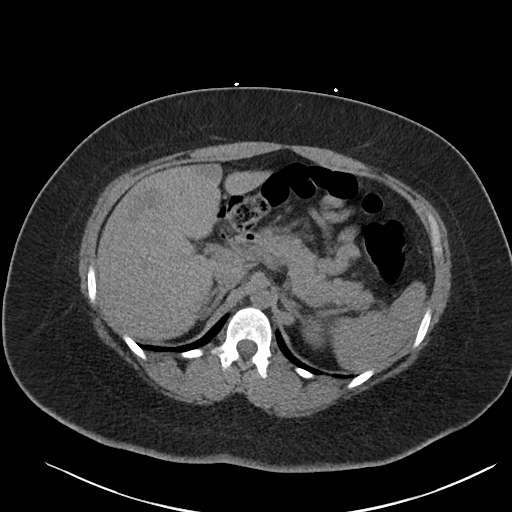
[im 72/88  soft-tissue]
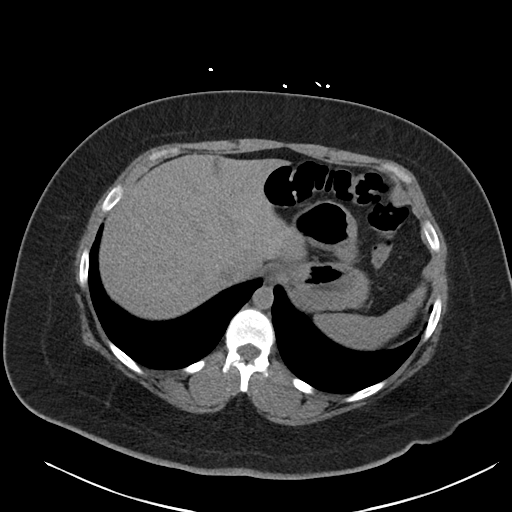
[im 76/88  soft-tissue]
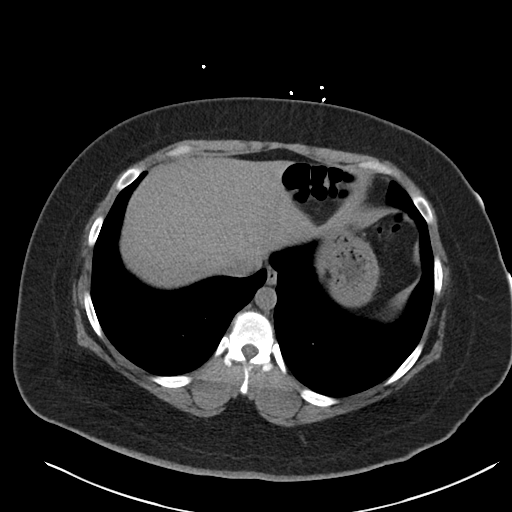
[im 84/88  soft-tissue]
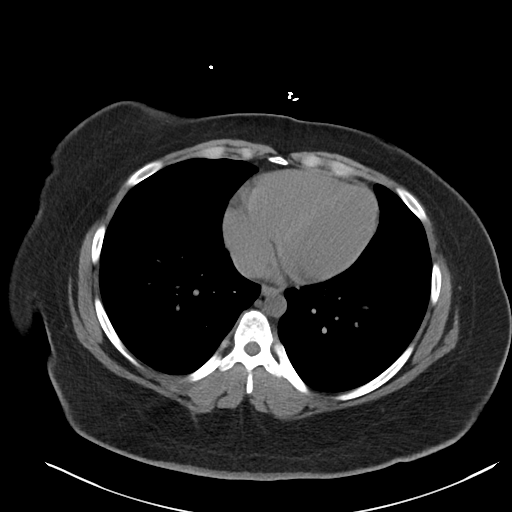

[Series 6: coronal · coronal · 0.78mm/px · 3 of 101 slices shown]
[im 45/101  soft-tissue]
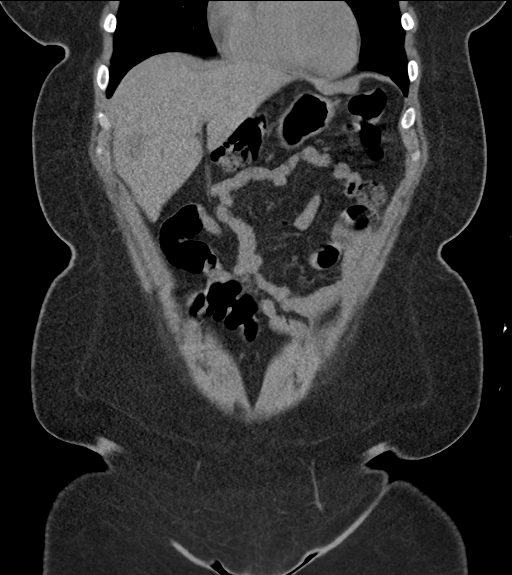
[im 56/101  soft-tissue]
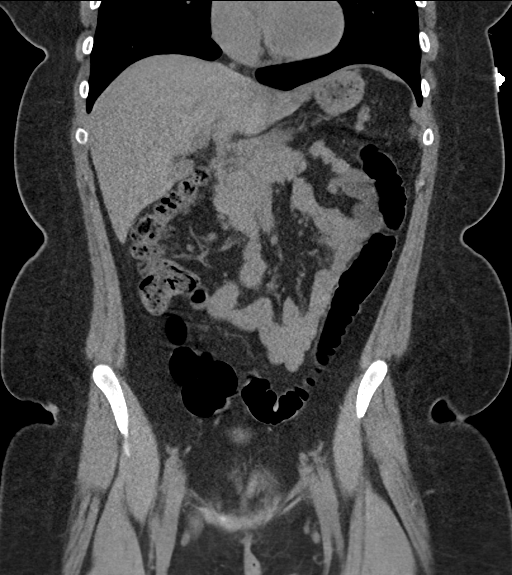
[im 67/101  soft-tissue]
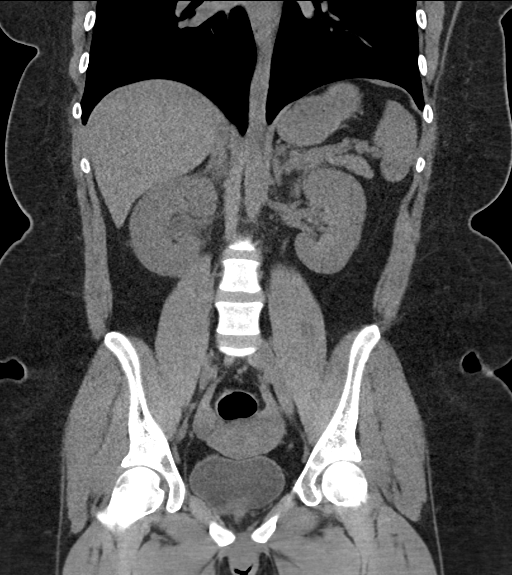

[17 of 46 positions shown; findings below may reference images not displayed]

FINDINGS: Lower chest: No acute abnormality.

Hepatobiliary: There is an approximately 2.7 x 2.3 cm
hypoattenuating lesion the right hepatic lobe. Additional smaller
hypoattenuating lesion along the fissure for the ligamentum teres.
Gallbladder is unremarkable. No biliary dilatation.

Pancreas: Unremarkable.

Spleen: Unremarkable.

Adrenals/Urinary Tract: Adrenals are unremarkable. There are no
renal calculi. No hydronephrosis. Mild right perinephric stranding.
No ureteral calculus. Partially distended bladder is unremarkable.

Stomach/Bowel: Stomach is within normal limits. Bowel is normal in
caliber. Appendix is normal.

Vascular/Lymphatic: No significant vascular abnormality. No enlarged
lymph nodes identified

Reproductive: Uterus and bilateral adnexa are unremarkable.

Other: No free fluid.  Abdominal wall is unremarkable

Musculoskeletal: No significant or acute osseous abnormality.
IMPRESSION: Mild right perinephric stranding. May reflect pyelonephritis, which
is not well evaluated on this study or recently passed calculus.

Indeterminate 2.7 cm right hepatic lobe lesion. Additional smaller
lesion along the fissure for the ligamentum teres could reflect
focal fat. Ultrasound recommended for further evaluation.
